# Patient Record
Sex: Female | Born: 1945
Health system: Southern US, Community
[De-identification: ages and names within clinical notes are randomized; demographics above are authoritative.]

## PROBLEM LIST (undated history)

## (undated) DIAGNOSIS — E7800 Pure hypercholesterolemia, unspecified: Secondary | ICD-10-CM

## (undated) DIAGNOSIS — F32A Depression, unspecified: Secondary | ICD-10-CM

## (undated) DIAGNOSIS — IMO0002 Reserved for concepts with insufficient information to code with codable children: Secondary | ICD-10-CM

## (undated) DIAGNOSIS — I10 Essential (primary) hypertension: Secondary | ICD-10-CM

## (undated) DIAGNOSIS — F329 Major depressive disorder, single episode, unspecified: Secondary | ICD-10-CM

## (undated) HISTORY — DX: Reserved for concepts with insufficient information to code with codable children: IMO0002

## (undated) HISTORY — PX: ELBOW ARTHROPLASTY: SHX928

---

## 2013-10-12 ENCOUNTER — Emergency Department (HOSPITAL_BASED_OUTPATIENT_CLINIC_OR_DEPARTMENT_OTHER)
Admission: EM | Admit: 2013-10-12 | Discharge: 2013-10-12 | Disposition: A | Discharge status: Home / Self Care | Payer: Medicare Other | Attending: Emergency Medicine | Admitting: Emergency Medicine

## 2013-10-12 ENCOUNTER — Encounter (HOSPITAL_BASED_OUTPATIENT_CLINIC_OR_DEPARTMENT_OTHER): Payer: Self-pay | Admitting: Emergency Medicine

## 2013-10-12 DIAGNOSIS — Z79899 Other long term (current) drug therapy: Secondary | ICD-10-CM | POA: Insufficient documentation

## 2013-10-12 DIAGNOSIS — G8918 Other acute postprocedural pain: Secondary | ICD-10-CM | POA: Insufficient documentation

## 2013-10-12 DIAGNOSIS — T85848A Pain due to other internal prosthetic devices, implants and grafts, initial encounter: Secondary | ICD-10-CM

## 2013-10-12 DIAGNOSIS — K089 Disorder of teeth and supporting structures, unspecified: Secondary | ICD-10-CM | POA: Insufficient documentation

## 2013-10-12 DIAGNOSIS — E7800 Pure hypercholesterolemia, unspecified: Secondary | ICD-10-CM | POA: Insufficient documentation

## 2013-10-12 DIAGNOSIS — K08109 Complete loss of teeth, unspecified cause, unspecified class: Secondary | ICD-10-CM | POA: Insufficient documentation

## 2013-10-12 DIAGNOSIS — I10 Essential (primary) hypertension: Secondary | ICD-10-CM | POA: Insufficient documentation

## 2013-10-12 HISTORY — DX: Essential (primary) hypertension: I10

## 2013-10-12 HISTORY — DX: Pure hypercholesterolemia, unspecified: E78.00

## 2013-10-12 MED ORDER — OXYCODONE-ACETAMINOPHEN 5-325 MG PO TABS: 1.0000 | ORAL_TABLET | ORAL | Status: DC | PRN

## 2013-10-12 MED ORDER — OXYCODONE-ACETAMINOPHEN 5-325 MG PO TABS
2.0000 | ORAL_TABLET | Freq: Once | ORAL | Status: AC
  Administered 2013-10-12: 2 via ORAL
  Filled 2013-10-12: qty 2

## 2013-10-12 NOTE — ED Notes (Addendum)
Patient reports that she has ongoing right upper gum pain since temporary bridge was placed 6 weeks ago. Has followed up with her dentist, oral surgeon, endodontist and all have told her that it is not related to her bridge. Dentist has recommended neurologist now and has appt for may 16th but patient doesn't want to wait that long. Taking vicodin with minimal relief.  No swelling or redness noted to gums, reports that the skin above her top lip is also very tender. Oral surgeon suggests that original dentist needs to remove temporary and place permanent bridge but dentist refusing.

## 2013-10-12 NOTE — Discharge Instructions (Signed)
Percocet as needed for pain.  Recommend that you follow up with your dentist regarding removal of the dental prosthesis.

## 2013-10-12 NOTE — ED Provider Notes (Signed)
CSN: [id number]     Arrival date & time 10/12/13  0093 History   First MD Initiated Contact with Patient 10/12/13 1049     Chief Complaint  Patient presents with  . Dental Problem      HPI  Presents with tooth and gum pain. She states that she had an injury to her T. several years ago. Her right teeth have been extracted. Ultimately her left incisor had to be extracted. She has a dental implant, temporary bridge is placed 6 weeks ago. She's had unrelenting pain in the area since that time. She has since been back to her dentist. Husband states "we've seen 5 dentist". Also see oral surgery and an endodontist. She states her regular dentist "refuses" to take the temporary bridge out. She states that he is suggested she see a neurologist because "it is neurological".  Past Medical History  Diagnosis Date  . Hypertension   . High cholesterol    History reviewed. No pertinent past surgical history. No family history on file. History  Substance Use Topics  . Smoking status: Never Smoker   . Smokeless tobacco: Not on file  . Alcohol Use: Not on file   OB History   Grav Para Term Preterm Abortions TAB SAB Ect Mult Living                 Review of Systems  HENT: Positive for dental problem.        Has pain just below her left naris. No rash or vesicles. No pain with chewing. Is not temperature-sensitive. No vesicles to the face. No vision changes. No jaw or neck pain      Allergies  Review of patient's allergies indicates no known allergies.  Home Medications   Prior to Admission medications   Medication Sig Start Date End Date Taking? Authorizing Provider  carvedilol (COREG) 6.25 MG tablet Take 6.25 mg by mouth 2 (two) times daily with a meal.   Yes Historical Provider, MD  simvastatin (ZOCOR) 40 MG tablet Take 40 mg by mouth daily.   Yes Historical Provider, MD  triamterene-hydrochlorothiazide (DYAZIDE) 37.5-25 MG per capsule Take 1 capsule by mouth daily.   Yes Historical  Provider, MD  zolpidem (AMBIEN) 5 MG tablet Take 5 mg by mouth at bedtime as needed for sleep.   Yes Historical Provider, MD  oxyCODONE-acetaminophen (PERCOCET/ROXICET) 5-325 MG per tablet Take 1-2 tablets by mouth every 4 (four) hours as needed. 10/12/13   Tanna Furry, MD   BP 142/70  Pulse 78  Temp(Src) 97.9 F (36.6 C) (Oral)  Resp 14  Ht 5\' 3"  (1.6 m)  Wt 124 lb (56.246 kg)  BMI 21.97 kg/m2  SpO2 99% Physical Exam  HENT:  Head:    Mouth/Throat:      ED Course  Procedures (including critical care time) Labs Review Labs Reviewed - No data to display  Imaging Review No results found.   EKG Interpretation None      MDM   Final diagnoses:  Dental implant pain    Patient has some obvious discomfort. Clinically no sign of tissue reaction erythema blistering gingival swelling pallor or buccal swelling. No external facial swelling. No symptoms of that suggest an alternate or neurological diagnoses such as trigeminal neuralgia. Nontender to palpate along the sinuses along the course of the infraorbital nerve. No asymmetry to suggest zoster. Her symptoms started when the implant was placed in a persisted. I recommended she see her dentist regarding extraction.    Elta Guadeloupe  Jeneen Rinks, MD 10/12/13 [phone]

## 2013-10-13 ENCOUNTER — Encounter: Payer: Self-pay | Admitting: Neurology

## 2013-10-14 ENCOUNTER — Encounter: Payer: Self-pay | Admitting: Neurology

## 2013-10-14 ENCOUNTER — Ambulatory Visit (INDEPENDENT_AMBULATORY_CARE_PROVIDER_SITE_OTHER): Payer: Medicare Other | Admitting: Neurology

## 2013-10-14 VITALS — BP 145/84 | HR 70 | Ht 63.0 in | Wt 125.0 lb

## 2013-10-14 DIAGNOSIS — IMO0002 Reserved for concepts with insufficient information to code with codable children: Secondary | ICD-10-CM

## 2013-10-14 DIAGNOSIS — M792 Neuralgia and neuritis, unspecified: Secondary | ICD-10-CM | POA: Insufficient documentation

## 2013-10-14 HISTORY — DX: Reserved for concepts with insufficient information to code with codable children: IMO0002

## 2013-10-14 MED ORDER — PREGABALIN 25 MG PO CAPS: ORAL_CAPSULE | ORAL | Status: DC

## 2013-10-14 NOTE — Patient Instructions (Signed)

## 2013-10-14 NOTE — Progress Notes (Signed)
Reason for visit: Neuralgia pain  Rebekah Mason is a 68 y.o. female  History of present illness:  Rebekah Mason is a 68 year old right-handed white female with a history of onset of a burning dysesthesia affecting the gum area just behind the left incisor, also affecting the skin just under the left naris. The patient indicates that the discomfort began immediately after a dental procedure 6 weeks ago. The patient had a tooth pulled at that point. The burning dysesthesias have been present since that procedure, slightly worsening over time. She denies any pain with chewing, talking, or swallowing. The patient indicates that the dysesthesias do not cross midline. She has no numbness of the face or inside the mouth. The patient has not noted any other sensory alteration on the body or face. She denies any weakness of the extremities, balance problems, or problems controlling the bowels or the bladder with exception that she does have some diarrhea at times. She indicates that there are no other headache problems, or neck problems. The patient is sent to this office for further evaluation.   Past Medical History  Diagnosis Date  . Hypertension   . High cholesterol   . Neuralgia, neuritis, and radiculitis, unspecified 10/14/2013    Past Surgical History  Procedure Laterality Date  . Elbow arthroplasty      2007    Family History  Problem Relation Age of Onset  . Heart attack Mother   . Hypertension Mother   . Aneurysm Father   . Hypertension Father     Social history:  reports that she has quit smoking. She has never used smokeless tobacco. She reports that she drinks about 8.4 ounces of alcohol per week. She reports that she does not use illicit drugs.  Medications:  Current Outpatient Prescriptions on File Prior to Visit  Medication Sig Dispense Refill  . carvedilol (COREG) 6.25 MG tablet Take 6.25 mg by mouth 2 (two) times daily with a meal.      . hydrOXYzine (ATARAX/VISTARIL)  50 MG tablet Take 50 mg by mouth daily.      Marland Kitchen oxyCODONE-acetaminophen (PERCOCET/ROXICET) 5-325 MG per tablet Take 1-2 tablets by mouth every 4 (four) hours as needed.  20 tablet  0  . simvastatin (ZOCOR) 40 MG tablet Take 40 mg by mouth daily.      Marland Kitchen triamterene-hydrochlorothiazide (DYAZIDE) 37.5-25 MG per capsule Take 1 capsule by mouth daily.      Marland Kitchen zolpidem (AMBIEN) 5 MG tablet Take 5 mg by mouth at bedtime as needed for sleep.       No current facility-administered medications on file prior to visit.     No Known Allergies  ROS:  Out of a complete 14 system review of symptoms, the patient complains only of the following symptoms, and all other reviewed systems are negative.  Weight loss Ringing in the ears Increased thirst Difficulty swallowing Anxiety, change in appetite Insomnia  Blood pressure 145/84, pulse 70, height 5\' 3"  (1.6 m), weight 125 lb (56.7 kg).  Physical Exam  General: The patient is alert and cooperative at the time of the examination.  Eyes: Pupils are equal, round, and reactive to light. Discs are flat bilaterally.  Neck: The neck is supple, no carotid bruits are noted.  Respiratory: The respiratory examination is clear.  Cardiovascular: The cardiovascular examination reveals a regular rate and rhythm, no obvious murmurs or rubs are noted.  Skin: Extremities are without significant edema.  Neurologic Exam  Mental status: The patient is  alert and oriented x 3 at the time of the examination. The patient has apparent normal recent and remote memory, with an apparently normal attention span and concentration ability.  Cranial nerves: Facial symmetry is present. There is good sensation of the face to pinprick and soft touch bilaterally. The strength of the facial muscles and the muscles to head turning and shoulder shrug are normal bilaterally. Speech is well enunciated, no aphasia or dysarthria is noted. Extraocular movements are full. Visual fields are  full. The tongue is midline, and the patient has symmetric elevation of the soft palate. No obvious hearing deficits are noted.  Motor: The motor testing reveals 5 over 5 strength of all 4 extremities. Good symmetric motor tone is noted throughout.  Sensory: Sensory testing is intact to pinprick, soft touch, vibration sensation, and position sense on all 4 extremities. No evidence of extinction is noted.  Coordination: Cerebellar testing reveals good finger-nose-finger and heel-to-shin bilaterally.  Gait and station: Gait is normal. Tandem gait is normal. Romberg is negative. No drift is seen.  Reflexes: Deep tendon reflexes are symmetric and normal bilaterally. Toes are downgoing bilaterally.   Assessment/Plan:  1. 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 The patient is on oxycodone for pain relief with some benefit. I will give a trial on Lyrica, gradually building up on the dose. If 50 mg twice daily is not effective, she will contact our office and we will continue to increase the dose. The patient will followup in 3 or 4 months. Hopefully over time, the neuralgia pain will resolve.  Jill Alexanders MD 10/14/2013 6:31 PM  [facility] [address] [place], [place] [address]  Phone [phone] Fax [phone]

## 2013-10-15 ENCOUNTER — Telehealth: Payer: Self-pay | Admitting: *Deleted

## 2013-10-15 NOTE — Telephone Encounter (Signed)
I have contacted insurance asking that they grant an exception and cover this drug.  They have forwarded the clinical info to medical review board for determination.  They will notify both [place] and the patient of the outcome.  I spoke with the patient, she is aware.

## 2013-10-15 NOTE — Telephone Encounter (Signed)
Patient calling about rx for pregabalin (LYRICA) 25 MG capsule hadn't been received @ [facility] [address].  Please call pt.  Thanks

## 2013-10-15 NOTE — Telephone Encounter (Signed)
Pt calling about Rx. Please advise

## 2013-10-15 NOTE — Telephone Encounter (Signed)
This Rx was faxed to the pharmacy yesterday, we have confirmation they received it.  I called the pharmacy.  Spoke with Jenny Reichmann.  He said they did get the Rx, but do not have the drug in stock.  They have ordered it, and will let the patient know when it arrives.  I called the patient back.  She is aware.

## 2013-10-16 ENCOUNTER — Telehealth: Payer: Self-pay

## 2013-10-16 NOTE — Telephone Encounter (Signed)
[employer] sent [place] a letter saying they have approved our request for coverage on Lyrica effective until 10/29/13 noting for titration purposes only. After that they require the dose be increased to a higher strength where patient is taking no more than 3 caps total daily.  Ref # [id number]

## 2013-10-17 DIAGNOSIS — IMO0002 Reserved for concepts with insufficient information to code with codable children: Secondary | ICD-10-CM

## 2013-10-17 HISTORY — DX: Reserved for concepts with insufficient information to code with codable children: IMO0002

## 2013-10-27 ENCOUNTER — Telehealth: Payer: Self-pay | Admitting: *Deleted

## 2013-10-27 NOTE — Telephone Encounter (Signed)
Patient calling and stated noticed hands are swollen.  Wanted to know if this was a side effect from pregabalin (LYRICA) 25 MG capsule.  Please call and advise.  Thanks

## 2013-10-28 NOTE — Telephone Encounter (Signed)
I called the patient. The lyrica is making her swell. She is on a diuretic, I have indicated that she needs to be careful about salt intake. The Lyrica could make her hands well, usually affects the feet and ankles.

## 2013-11-14 ENCOUNTER — Telehealth: Payer: Self-pay | Admitting: Neurology

## 2013-11-14 NOTE — Telephone Encounter (Signed)
Patient states she went to pick up her Rx for Lyrica at [facility] in [facility] and they told her it had to be pre authorized--pharmacy states paperwork was faxed to us--please advise patient--patient has only 2 days left of medication--thank you.

## 2013-11-14 NOTE — Telephone Encounter (Signed)
The pharmacy faxed [place] after hours yesterday, so we did not get the request until this morning.  I have contacted the ins and asked that they grant an exception.  I spoke with the patient, she is aware.  I spoke with the pharmacy as well.

## 2013-12-17 ENCOUNTER — Other Ambulatory Visit: Payer: Self-pay

## 2013-12-17 MED ORDER — PREGABALIN 50 MG PO CAPS: 50.0000 mg | ORAL_CAPSULE | Freq: Two times a day (BID) | ORAL | Status: DC

## 2013-12-17 NOTE — Telephone Encounter (Signed)
Ins will not pay for 25mg  two twice daily, but will pay for 50mg  one twice daily.  The total dose remains the same.

## 2013-12-17 NOTE — Telephone Encounter (Signed)
Rx signed and faxed.

## 2014-01-07 ENCOUNTER — Telehealth: Payer: Self-pay | Admitting: Neurology

## 2014-01-07 NOTE — Telephone Encounter (Signed)
Patient states she will run out of Lyrica before her scheduled 01/20/14 visit, requesting refill.

## 2014-01-07 NOTE — Telephone Encounter (Signed)
Refills were sent to the pharmacy earlier this month.  I called the pharmacy.  They said the patient does have refills on file and they will fill the Rx today.  I called the patient back.  She is aware.

## 2014-01-13 ENCOUNTER — Ambulatory Visit: Payer: Medicare Other | Admitting: Adult Health

## 2014-01-20 ENCOUNTER — Encounter: Payer: Self-pay | Admitting: Adult Health

## 2014-01-20 ENCOUNTER — Ambulatory Visit (INDEPENDENT_AMBULATORY_CARE_PROVIDER_SITE_OTHER): Payer: Medicare Other | Admitting: Adult Health

## 2014-01-20 VITALS — BP 150/95 | HR 83 | Ht 62.5 in | Wt 135.0 lb

## 2014-01-20 DIAGNOSIS — IMO0002 Reserved for concepts with insufficient information to code with codable children: Secondary | ICD-10-CM

## 2014-01-20 MED ORDER — PREGABALIN 50 MG PO CAPS: 50.0000 mg | ORAL_CAPSULE | Freq: Three times a day (TID) | ORAL | Status: DC

## 2014-01-20 NOTE — Patient Instructions (Signed)

## 2014-01-20 NOTE — Progress Notes (Signed)
PATIENT: Rebekah Mason DOB: 27-Jun-1945  REASON FOR VISIT: follow up HISTORY FROM: patient  HISTORY OF PRESENT ILLNESS: Rebekah Mason is a 68 year old female with a history of neuropathic pain affecting the gum just behind the left incisor. The pain occurred right after a dental procedure. She is currently taking lyrica and is tolerating it well. She reports that it has been beneficial for her pain. She states she has good days and bad days. Today she states that the discomfort is worse whereas yesterday her pain was under control. Patient reports that the discomfort is most notable in the afternoons. She does report some swelling on the Lyrica, mainly in the hands. She also reports tinnitus that was present before the dental procedure leading to this neuropathic discomfort. She states that it is usually has a buzzing or ringing quality. She states that it comes and goes. She does feel that it has improved over time. Patient returns today for follow-up.   REVIEW OF SYSTEMS: Full 14 system review of systems performed and notable only for:  Constitutional: N/A  Eyes: N/A Ear/Nose/Throat: Ringing in the ears Skin: N/A  Cardiovascular: N/A  Respiratory: N/A  Gastrointestinal: N/A  Genitourinary: N/A Hematology/Lymphatic: N/A  Endocrine: Excessive thirst Musculoskeletal: Joint swelling Allergy/Immunology: N/A  Neurological: N/A Psychiatric: Depression, nervous, anxious  Sleep: Insomnia  ALLERGIES: No Known Allergies  HOME MEDICATIONS: Outpatient Prescriptions Prior to Visit  Medication Sig Dispense Refill  . carvedilol (COREG) 6.25 MG tablet Take 6.25 mg by mouth 2 (two) times daily with a meal.      . chlorhexidine (PERIDEX) 0.12 % solution Use as directed 0.12 mLs in the mouth or throat daily.      . hydrOXYzine (ATARAX/VISTARIL) 50 MG tablet Take 50 mg by mouth daily.      . pregabalin (LYRICA) 50 MG capsule Take 1 capsule (50 mg total) by mouth 2 (two) times daily.  60 capsule  3    . triamterene-hydrochlorothiazide (DYAZIDE) 37.5-25 MG per capsule Take 1 capsule by mouth daily.      Marland Kitchen zolpidem (AMBIEN) 5 MG tablet Take 5 mg by mouth at bedtime as needed for sleep.      Marland Kitchen HYDROcodone-acetaminophen (NORCO/VICODIN) 5-325 MG per tablet Take 1 tablet by mouth 2 (two) times daily.      Marland Kitchen oxyCODONE-acetaminophen (PERCOCET/ROXICET) 5-325 MG per tablet Take 1-2 tablets by mouth every 4 (four) hours as needed.  20 tablet  0  . simvastatin (ZOCOR) 40 MG tablet Take 40 mg by mouth daily.       No facility-administered medications prior to visit.    PAST MEDICAL HISTORY: Past Medical History  Diagnosis Date  . Hypertension   . High cholesterol   . Neuralgia, neuritis, and radiculitis, unspecified 10/14/2013  . Ulcer 10/2013    PAST SURGICAL HISTORY: Past Surgical History  Procedure Laterality Date  . Elbow arthroplasty      2007    FAMILY HISTORY: Family History  Problem Relation Age of Onset  . Heart attack Mother   . Hypertension Mother   . Aneurysm Father   . Hypertension Father     SOCIAL HISTORY: History   Social History  . Marital Status: Married    Spouse Name: Herbie Baltimore    Number of Children: 3  . Years of Education: hs   Occupational History  . Retired    Social History Main Topics  . Smoking status: Former Research scientist (life sciences)  . Smokeless tobacco: Never Used  . Alcohol Use: 8.4  oz/week    14 Glasses of wine per week     Comment: daily  . Drug Use: No  . Sexual Activity: Not on file   Other Topics Concern  . Not on file   Social History Narrative   Patient is right handed ,resides in home with husband      PHYSICAL EXAM  Filed Vitals:   01/20/14 1325  BP: 150/95  Pulse: 83  Height: 5' 2.5" (1.588 m)  Weight: 135 lb (61.236 kg)   Body mass index is 24.28 kg/(m^2).  Generalized: Well developed, in no acute distress   Neck: Supple, no carotid bruits  Ears: Ear canal clear. Tympanic membrane intact, light reflex noted bilaterally.   Musculoskeletal: No deformity  Skin: No edema noted in hands or legs.   Neurological examination  Mentation: Alert oriented to time, place, history taking. Follows all commands speech and language fluent Cranial nerve II-XII: Extraocular movements were full, visual field were full on confrontational test.  Head turning and shoulder shrug  were normal and symmetric. Motor: The motor testing reveals 5 over 5 strength of all 4 extremities. Good symmetric motor tone is noted throughout.  Sensory: Sensory testing is intact to soft touch on all 4 extremities. No evidence of extinction is noted.  Coordination: Cerebellar testing reveals good finger-nose-finger and heel-to-shin bilaterally.  Gait and station: Gait is normal. Tandem gait is normal. Romberg is negative. No drift is seen.  Reflexes: Deep tendon reflexes are symmetric and normal bilaterally. Toes are downgoing bilaterally.   DIAGNOSTIC DATA (LABS, IMAGING, TESTING) - I reviewed patient records, labs, notes, testing and imaging myself where available.  ASSESSMENT AND PLAN 68 y.o. year old female  has a past medical history of Hypertension; High cholesterol; Neuralgia, neuritis, and radiculitis, unspecified (10/14/2013); and Ulcer (10/2013). here with:  1. Neuralgia/ neuritis  Patient's discomfort has been relieved by Lyrica. However she continues to have some days where the discomfort becomes most noticeable in the afternoons. I will increase the Lyrica to 50 mg 3 times a day. Patient also reports some tinnitus that has been present for some time. She does report that it has improved and it is not constant. If this continues to be an issue patient should make our office or her PCP aware. At that time a further workup may be warranted. Patient should follow up in 6 months or sooner if needed.  Ward Givens, MSN, NP-C 01/20/2014, 1:32 PM [facility] [address], [place], [place] [address] [phone]  Note:  This document was prepared with digital dictation and possible smart phrase technology. Any transcriptional errors that result from this process are unintentional.

## 2014-01-20 NOTE — Progress Notes (Signed)
I have read the note, and I agree with the clinical assessment and plan.  Kveon Casanas KEITH   

## 2014-05-06 ENCOUNTER — Encounter: Payer: Self-pay | Admitting: Neurology

## 2014-05-12 ENCOUNTER — Encounter: Payer: Self-pay | Admitting: Neurology

## 2014-05-13 ENCOUNTER — Other Ambulatory Visit: Payer: Self-pay | Admitting: Neurology

## 2014-05-18 ENCOUNTER — Other Ambulatory Visit: Payer: Self-pay

## 2014-05-18 ENCOUNTER — Telehealth: Payer: Self-pay | Admitting: Adult Health

## 2014-05-18 MED ORDER — PREGABALIN 50 MG PO CAPS: 50.0000 mg | ORAL_CAPSULE | Freq: Three times a day (TID) | ORAL | Status: DC

## 2014-05-18 NOTE — Telephone Encounter (Signed)
Patient requesting less expensive medication alternative for Rx pregabalin (LYRICA) 50 MG capsule.  Please call and advise.

## 2014-05-18 NOTE — Telephone Encounter (Signed)
I called the patient. I left a message on her cell phone. Her cell phone only rang one time then went straight to voicemail. Also tried to call her home phone but it appears this number is no longer working.

## 2014-05-19 MED ORDER — GABAPENTIN 100 MG PO CAPS: 100.0000 mg | ORAL_CAPSULE | Freq: Three times a day (TID) | ORAL | Status: DC

## 2014-05-19 NOTE — Telephone Encounter (Signed)
I called the patient. She states that she cannot afford the Lyrica. She has not tried gabapentin. I will start patient on a low dose she will begin taking gabapentin 100mg  3 times a day. In the future we may have to increase this dose. Patient verbalizes understanding.

## 2014-05-19 NOTE — Telephone Encounter (Signed)
Patient is returning your call. She can be reached on her cell# [phone] or home# [phone]. Thank you.

## 2014-05-19 NOTE — Addendum Note (Signed)
Addended by: Trudie Buckler on: 05/19/2014 10:00 AM   Modules accepted: Orders, Medications

## 2014-05-22 ENCOUNTER — Telehealth: Payer: Self-pay | Admitting: Adult Health

## 2014-05-22 NOTE — Telephone Encounter (Signed)
Pt is having some serious diarrhea that started when she started the gabapentin and wants to know what she can take.  The gabapentin (NEURONTIN) 100 MG capsule seems to be working.  Please call and advise.

## 2014-05-23 NOTE — Telephone Encounter (Signed)
I called back (was not able to access chart yesterday due to system outage).  Patient said she is doing better now.  She took Triangle yesterday and Immodium this morning.  Says she previously had issues with diarrhea, and this may just be a continuation of that.  I explained the importance of staying hydrated.  Patient verbalized understanding.  Says Gabapentin has worked very well, and she does not wish to change meds.  If she has any other questions, or if diarrhea begins again she will call us back.

## 2014-07-23 ENCOUNTER — Ambulatory Visit: Payer: Medicare Other | Admitting: Adult Health

## 2014-08-08 ENCOUNTER — Other Ambulatory Visit: Payer: Self-pay | Admitting: Adult Health

## 2014-09-03 ENCOUNTER — Ambulatory Visit (INDEPENDENT_AMBULATORY_CARE_PROVIDER_SITE_OTHER): Payer: Medicare Other | Admitting: Adult Health

## 2014-09-03 ENCOUNTER — Encounter: Payer: Self-pay | Admitting: Adult Health

## 2014-09-03 VITALS — BP 148/87 | HR 72 | Ht 62.0 in | Wt 132.0 lb

## 2014-09-03 DIAGNOSIS — M792 Neuralgia and neuritis, unspecified: Secondary | ICD-10-CM | POA: Diagnosis not present

## 2014-09-03 NOTE — Progress Notes (Signed)
PATIENT: Rebekah Mason DOB: 1945/07/14  REASON FOR VISIT: follow up- neuropathic pain HISTORY FROM: patient  HISTORY OF PRESENT ILLNESS: Rebekah Mason is a 69 year old female with a history of neuropathic pain affecting the area above her upper lip. She returns today for follow-up. She states that the gabapentin helped however in the last 2 weeks her pain has returned. She states that she had only been taking gabapentin as needed for pain. She states that it does cause her some drowsiness but overall she can tolerate it well. Patient states that she still has some tinnitus however that has improved. She denies any new neurological symptoms. Denies any new medical issues since last seen.  HISTORY 01/20/14: Rebekah Mason is a 69 year old female with a history of neuropathic pain affecting the gum just behind the left incisor. The pain occurred right after a dental procedure. She is currently taking lyrica and is tolerating it well. She reports that it has been beneficial for her pain. She states she has good days and bad days. Today she states that the discomfort is worse whereas yesterday her pain was under control. Patient reports that the discomfort is most notable in the afternoons. She does report some swelling on the Lyrica, mainly in the hands. She also reports tinnitus that was present before the dental procedure leading to this neuropathic discomfort. She states that it is usually has a buzzing or ringing quality. She states that it comes and goes. She does feel that it has improved over time. Patient returns today for follow-up.   REVIEW OF SYSTEMS: Out of a complete 14 system review of symptoms, the patient complains only of the following symptoms, and all other reviewed systems are negative.  Diarrhea, ringing in ears, excessive thirst  ALLERGIES: No Known Allergies  HOME MEDICATIONS: Outpatient Prescriptions Prior to Visit  Medication Sig Dispense Refill  . carvedilol (COREG) 6.25 MG  tablet Take 6.25 mg by mouth 2 (two) times daily with a meal.    . gabapentin (NEURONTIN) 100 MG capsule TAKE 1 CAPSULE (100 MG TOTAL) BY MOUTH 3 (THREE) TIMES DAILY. 90 capsule 3  . triamterene-hydrochlorothiazide (DYAZIDE) 37.5-25 MG per capsule Take 1 capsule by mouth daily.    Marland Kitchen zolpidem (AMBIEN) 5 MG tablet Take 5 mg by mouth at bedtime as needed for sleep.    . chlorhexidine (PERIDEX) 0.12 % solution Use as directed 0.12 mLs in the mouth or throat daily.    . hydrOXYzine (ATARAX/VISTARIL) 50 MG tablet Take 50 mg by mouth daily.    . Omeprazole (PRILOSEC PO) Take by mouth. Once daily     No facility-administered medications prior to visit.    PAST MEDICAL HISTORY: Past Medical History  Diagnosis Date  . Hypertension   . High cholesterol   . Neuralgia, neuritis, and radiculitis, unspecified 10/14/2013  . Ulcer 10/2013    PAST SURGICAL HISTORY: Past Surgical History  Procedure Laterality Date  . Elbow arthroplasty      2007    FAMILY HISTORY: Family History  Problem Relation Age of Onset  . Heart attack Mother   . Hypertension Mother   . Aneurysm Father   . Hypertension Father     SOCIAL HISTORY: History   Social History  . Marital Status: Married    Spouse Name: Herbie Baltimore  . Number of Children: 3  . Years of Education: hs   Occupational History  . Retired    Social History Main Topics  . Smoking status: Former Research scientist (life sciences)  .  Smokeless tobacco: Never Used     Comment: Quit 25 years ago.  . Alcohol Use: 1.8 oz/week    3 Glasses of wine per week     Comment: daily with dinner  . Drug Use: No  . Sexual Activity: Not on file   Other Topics Concern  . Not on file   Social History Narrative   Patient lives at home with her husband Herbie Baltimore).   Retired.   Education high school.   Right handed.   Caffeine none.      PHYSICAL EXAM  Filed Vitals:   09/03/14 1415  BP: 148/87  Pulse: 72  Height: 5\' 2"  (1.575 m)  Weight: 132 lb (59.875 kg)   Body mass index  is 24.14 kg/(m^2).  Generalized: Well developed, in no acute distress   Neurological examination  Mentation: Alert oriented to time, place, history taking. Follows all commands speech and language fluent Cranial nerve II-XII: Pupils were equal round reactive to light. Extraocular movements were full, visual field were full on confrontational test. Facial sensation and strength were normal. Uvula tongue midline. Head turning and shoulder shrug  were normal and symmetric. Motor: The motor testing reveals 5 over 5 strength of all 4 extremities. Good symmetric motor tone is noted throughout.  Sensory: Sensory testing is intact to soft touch on all 4 extremities. No evidence of extinction is noted.  Coordination: Cerebellar testing reveals good finger-nose-finger and heel-to-shin bilaterally.  Gait and station: Gait is normal. Tandem gait is normal. Romberg is negative. No drift is seen.  Reflexes: Deep tendon reflexes are symmetric and normal bilaterally.  Marland Kitchen   DIAGNOSTIC DATA (LABS, IMAGING, TESTING) - I reviewed patient records, labs, notes, testing and imaging myself where available.     ASSESSMENT AND PLAN 69 y.o. year old female  has a past medical history of Hypertension; High cholesterol; Neuralgia, neuritis, and radiculitis, unspecified (10/14/2013); and Ulcer (10/2013). here with:  1. Neuropathic pain  The patient did have good results with gabapentin however in the last 2 weeks her pain has increased. I have encouraged the patient to use gabapentin consistently 100 mg 3 times a day. If she is unable to tolerate the drowsiness she can decrease to twice a day. The patient verbalized understanding. If her symptoms worsen or fail to improve with gabapentin she will let us know. Otherwise she will follow-up in 6 months or sooner if needed.  Ward Givens, MSN, NP-C 09/03/2014, 2:29 PM [facility] [address], [place], [place] [address] [phone]  Note:  This document was prepared with digital dictation and possible smart phrase technology. Any transcriptional errors that result from this process are unintentional.

## 2014-09-03 NOTE — Progress Notes (Signed)
I have read the note, and I agree with the clinical assessment and plan.  Loriel Diehl KEITH   

## 2014-09-03 NOTE — Patient Instructions (Signed)
Try taking gabapentin consistently. 100 mg three times a day. If drowsiness occurs you may decrease to twice a day.  If your pain worsens or fails to improve with gabapentin please let us know.

## 2014-12-06 ENCOUNTER — Other Ambulatory Visit: Payer: Self-pay | Admitting: Adult Health

## 2015-03-09 ENCOUNTER — Ambulatory Visit: Payer: Medicare Other | Admitting: Adult Health

## 2015-05-14 ENCOUNTER — Emergency Department (HOSPITAL_BASED_OUTPATIENT_CLINIC_OR_DEPARTMENT_OTHER)
Admission: EM | Admit: 2015-05-14 | Discharge: 2015-05-14 | Disposition: A | Discharge status: Home / Self Care | Payer: Medicare Other | Attending: Emergency Medicine | Admitting: Emergency Medicine

## 2015-05-14 ENCOUNTER — Emergency Department (HOSPITAL_BASED_OUTPATIENT_CLINIC_OR_DEPARTMENT_OTHER): Payer: Medicare Other

## 2015-05-14 ENCOUNTER — Encounter (HOSPITAL_BASED_OUTPATIENT_CLINIC_OR_DEPARTMENT_OTHER): Payer: Self-pay

## 2015-05-14 DIAGNOSIS — Y9389 Activity, other specified: Secondary | ICD-10-CM | POA: Diagnosis not present

## 2015-05-14 DIAGNOSIS — F329 Major depressive disorder, single episode, unspecified: Secondary | ICD-10-CM | POA: Diagnosis not present

## 2015-05-14 DIAGNOSIS — S0990XA Unspecified injury of head, initial encounter: Secondary | ICD-10-CM | POA: Diagnosis present

## 2015-05-14 DIAGNOSIS — E7800 Pure hypercholesterolemia, unspecified: Secondary | ICD-10-CM | POA: Diagnosis not present

## 2015-05-14 DIAGNOSIS — S0101XA Laceration without foreign body of scalp, initial encounter: Secondary | ICD-10-CM | POA: Diagnosis not present

## 2015-05-14 DIAGNOSIS — S060X0A Concussion without loss of consciousness, initial encounter: Secondary | ICD-10-CM | POA: Diagnosis not present

## 2015-05-14 DIAGNOSIS — W01198A Fall on same level from slipping, tripping and stumbling with subsequent striking against other object, initial encounter: Secondary | ICD-10-CM | POA: Insufficient documentation

## 2015-05-14 DIAGNOSIS — Z79899 Other long term (current) drug therapy: Secondary | ICD-10-CM | POA: Diagnosis not present

## 2015-05-14 DIAGNOSIS — Y998 Other external cause status: Secondary | ICD-10-CM | POA: Diagnosis not present

## 2015-05-14 DIAGNOSIS — I10 Essential (primary) hypertension: Secondary | ICD-10-CM | POA: Diagnosis not present

## 2015-05-14 DIAGNOSIS — Z87891 Personal history of nicotine dependence: Secondary | ICD-10-CM | POA: Diagnosis not present

## 2015-05-14 DIAGNOSIS — Y92000 Kitchen of unspecified non-institutional (private) residence as  the place of occurrence of the external cause: Secondary | ICD-10-CM | POA: Diagnosis not present

## 2015-05-14 HISTORY — DX: Depression, unspecified: F32.A

## 2015-05-14 HISTORY — DX: Major depressive disorder, single episode, unspecified: F32.9

## 2015-05-14 NOTE — ED Provider Notes (Signed)
CSN: [id number]     Arrival date & time 05/14/15  [id number] History  By signing my name below, I, Helane Gunther, attest that this documentation has been prepared under the direction and in the presence of Debby Freiberg, MD. Electronically Signed: Helane Gunther, ED Scribe. 05/14/2015. 8:32 PM.     Chief Complaint  Patient presents with  . Head Injury   The history is provided by the patient. No language interpreter was used.   HPI Comments: Rebekah Mason is a 69 y.o. female who presents to the Emergency Department complaining of a head injury sustained 30 minutes ago. Pt states she was in the kitchen when she slipped in some water on the floor and struck her head on the kitchen cabinets, sustaining a laceration to the left side of her skull. She reports associated HA and nausea. She reports a PMHx of HTN and high cholesterol. She states she is UTD on her tetanus. Pt denies LOC, visual disturbances, and vomiting.   Past Medical History  Diagnosis Date  . Hypertension   . High cholesterol   . Neuralgia, neuritis, and radiculitis, unspecified 10/14/2013  . Ulcer 10/2013  . Depression    Past Surgical History  Procedure Laterality Date  . Elbow arthroplasty      2007   Family History  Problem Relation Age of Onset  . Heart attack Mother   . Hypertension Mother   . Aneurysm Father   . Hypertension Father    Social History  Substance Use Topics  . Smoking status: Former Research scientist (life sciences)  . Smokeless tobacco: Never Used     Comment: Quit 25 years ago.  . Alcohol Use: 1.8 oz/week    3 Glasses of wine per week     Comment: daily with dinner   OB History    No data available     Review of Systems  Eyes: Negative for visual disturbance.  Gastrointestinal: Positive for nausea. Negative for vomiting.  Skin: Positive for wound.  Neurological: Positive for headaches. Negative for syncope.  All other systems reviewed and are negative.   Allergies  Review of patient's allergies indicates no  known allergies.  Home Medications   Prior to Admission medications   Medication Sig Start Date End Date Taking? Authorizing Provider  Sertraline HCl (ZOLOFT PO) Take by mouth.   Yes Historical Provider, MD  atorvastatin (LIPITOR) 20 MG tablet 20 mg daily. 08/04/14   Historical Provider, MD  carvedilol (COREG) 6.25 MG tablet Take 6.25 mg by mouth 2 (two) times daily with a meal.    Historical Provider, MD  gabapentin (NEURONTIN) 100 MG capsule TAKE 1 CAPSULE (100 MG TOTAL) BY MOUTH 3 (THREE) TIMES DAILY. 12/06/14   Ward Givens, NP  omeprazole (PRILOSEC) 40 MG capsule 40 mg daily. 08/08/14   Historical Provider, MD  traZODone (DESYREL) 50 MG tablet 50 mg as needed. 08/30/14   Historical Provider, MD  triamterene-hydrochlorothiazide (DYAZIDE) 37.5-25 MG per capsule Take 1 capsule by mouth daily.    Historical Provider, MD  zolpidem (AMBIEN) 5 MG tablet Take 5 mg by mouth at bedtime as needed for sleep.    Historical Provider, MD   BP 136/80 mmHg  Pulse 73  Temp(Src) 98.6 F (37 C) (Oral)  Resp 18  Ht 5\' 3"  (1.6 m)  Wt 130 lb (58.968 kg)  BMI 23.03 kg/m2  SpO2 97% Physical Exam  Constitutional: She is oriented to person, place, and time. She appears well-developed and well-nourished.  HENT:  Head: Normocephalic. Head is with  laceration (4 cm to L parietum).  Right Ear: External ear normal.  Left Ear: External ear normal.  Eyes: Conjunctivae and EOM are normal. Pupils are equal, round, and reactive to light.  Neck: Normal range of motion. Neck supple.  Cardiovascular: Normal rate, regular rhythm, normal heart sounds and intact distal pulses.   Pulmonary/Chest: Effort normal and breath sounds normal.  Abdominal: Soft. Bowel sounds are normal. There is no tenderness.  Musculoskeletal: Normal range of motion.  Neurological: She is alert and oriented to person, place, and time. She has normal strength and normal reflexes. No cranial nerve deficit or sensory deficit. Coordination normal.  GCS eye subscore is 4. GCS verbal subscore is 5. GCS motor subscore is 6.  Skin: Skin is warm and dry.  Vitals reviewed.   ED Course  .Marland KitchenLaceration Repair Date/Time: 05/14/2015 11:40 PM Performed by: Debby Freiberg Authorized by: Debby Freiberg Consent: Verbal consent obtained. Risks and benefits: risks, benefits and alternatives were discussed Body area: head/neck Location details: scalp Laceration length: 4 cm Foreign bodies: no foreign bodies Tendon involvement: none Nerve involvement: none Anesthesia: see MAR for details Irrigation solution: saline Irrigation method: syringe Amount of cleaning: standard Debridement: none Degree of undermining: none Skin closure: staples Number of sutures: 4 Technique: simple Approximation: close    DIAGNOSTIC STUDIES: Oxygen Saturation is 97% on RA, adequate by my interpretation.    COORDINATION OF CARE: 7:34 PM - Discussed plans to cleanse and staple the wound. Will order a CT scan.  Pt advised of plan for treatment and pt agrees.  Labs Review Labs Reviewed - No data to display  Imaging Review Ct Head Wo Contrast  05/14/2015  CLINICAL DATA:  Fall in kitchen 1 hour ago striking left occipital region. EXAM: CT HEAD WITHOUT CONTRAST TECHNIQUE: Contiguous axial images were obtained from the base of the skull through the vertex without intravenous contrast. COMPARISON:  None. FINDINGS: Ventricles, cisterns and other CSF spaces are within normal. There is no mass, mass effect, shift of midline structures or acute hemorrhage. There is no evidence of acute infarction. Small left posterior parietal scalp contusion. No fracture. IMPRESSION: No acute intracranial findings. Small left posterior parietal scalp contusion.  No fracture. Electronically Signed   By: Marin Olp M.D.   On: 05/14/2015 20:26   I have personally reviewed and evaluated these images and lab results as part of my medical decision-making.   EKG Interpretation None       MDM   Final diagnoses:  Concussion, without loss of consciousness, initial encounter    69 y.o. female with pertinent PMH of HTN, HLD, depression presents with mechanical fall with hit to L head.  No LOC, pt has had mild ha and nausea, otherwise without symptoms (no vomiting, numbness, weakness).  On arrival vitals and physical exam as above.  Repaired laceration as above.    I have reviewed all laboratory and imaging studies if ordered as above  1. Concussion, without loss of consciousness, initial encounter           Debby Freiberg, MD 05/14/15 [id number]

## 2015-05-14 NOTE — ED Notes (Signed)
Slipped in water in home and hit head on cabinet-lac noted to back of head

## 2015-05-14 NOTE — Discharge Instructions (Signed)
Stitches, Staples, or Adhesive Wound Closure Health care providers use stitches (sutures), staples, and certain glue (skin adhesives) to hold skin together while it heals (wound closure). You may need this treatment after you have surgery or if you cut your skin accidentally. These methods help your skin to heal more quickly and make it less likely that you will have a scar. A wound may take several months to heal completely. The type of wound you have determines when your wound gets closed. In most cases, the wound is closed as soon as possible (primary skin closure). Sometimes, closure is delayed so the wound can be cleaned and allowed to heal naturally. This reduces the chance of infection. Delayed closure may be needed if your wound:  Is caused by a bite.  Happened more than 6 hours ago.  Involves loss of skin or the tissues under the skin.  Has dirt or debris in it that cannot be removed.  Is infected. WHAT ARE THE DIFFERENT KINDS OF WOUND CLOSURES? There are many options for wound closure. The one that your health care provider uses depends on how deep and how large your wound is. Adhesive Glue To use this type of glue to close a wound, your health care provider holds the edges of the wound together and paints the glue on the surface of your skin. You may need more than one layer of glue. Then the wound may be covered with a light bandage (dressing). This type of skin closure may be used for small wounds that are not deep (superficial). Using glue for wound closure is less painful than other methods. It does not require a medicine that numbs the area (local anesthetic). This method also leaves nothing to be removed. Adhesive glue is often used for children and on facial wounds. Adhesive glue cannot be used for wounds that are deep, uneven, or bleeding. It is not used inside of a wound.  Adhesive Strips These strips are made of sticky (adhesive), porous paper. They are applied across your  skin edges like a regular adhesive bandage. You leave them on until they fall off. Adhesive strips may be used to close very superficial wounds. They may also be used along with sutures to improve the closure of your skin edges.  Sutures Sutures are the oldest method of wound closure. Sutures can be made from natural substances, such as silk, or from synthetic materials, such as nylon and steel. They can be made from a material that your body can break down as your wound heals (absorbable), or they can be made from a material that needs to be removed from your skin (nonabsorbable). They come in many different strengths and sizes. Your health care provider attaches the sutures to a steel needle on one end. Sutures can be passed through your skin, or through the tissues beneath your skin. Then they are tied and cut. Your skin edges may be closed in one continuous stitch or in separate stitches. Sutures are strong and can be used for all kinds of wounds. Absorbable sutures may be used to close tissues under the skin. The disadvantage of sutures is that they may cause skin reactions that lead to infection. Nonabsorbable sutures need to be removed. Staples When surgical staples are used to close a wound, the edges of your skin on both sides of the wound are brought close together. A staple is placed across the wound, and an instrument secures the edges together. Staples are often used to close surgical cuts (  incisions). Staples are faster to use than sutures, and they cause less skin reaction. Staples need to be removed using a tool that bends the staples away from your skin. HOW DO I CARE FOR MY WOUND CLOSURE?  Take medicines only as directed by your health care provider.  If you were prescribed an antibiotic medicine for your wound, finish it all even if you start to feel better.  Use ointments or creams only as directed by your health care provider.  Wash your hands with soap and water before and  after touching your wound.  Do not soak your wound in water. Do not take baths, swim, or use a hot tub until your health care provider approves.  Ask your health care provider when you can start showering. Cover your wound if directed by your health care provider.  Do not take out your own sutures or staples.  Do not pick at your wound. Picking can cause an infection.  Keep all follow-up visits as directed by your health care provider. This is important. HOW LONG WILL I HAVE MY WOUND CLOSURE?  Leave adhesive glue on your skin until the glue peels away.  Leave adhesive strips on your skin until the strips fall off.  Absorbable sutures will dissolve within several days.  Nonabsorbable sutures and staples must be removed. The location of the wound will determine how long they stay in. This can range from several days to a couple of weeks. WHEN SHOULD I SEEK HELP FOR MY WOUND CLOSURE? Contact your health care provider if:  You have a fever.  You have chills.  You have drainage, redness, swelling, or pain at your wound.  There is a bad smell coming from your wound.  The skin edges of your wound start to separate after your sutures have been removed.  Your wound becomes thick, raised, and darker in color after your sutures come out (scarring).   This information is not intended to replace advice given to you by your health care provider. Make sure you discuss any questions you have with your health care provider.   Document Released: 02/28/2001 Document Revised: 06/26/2014 Document Reviewed: 11/12/2013 Elsevier Interactive Patient Education 2016 [place], [place] A concussion, or closed-head injury, is a brain injury caused by a direct blow to the head or by a quick and sudden movement (jolt) of the head or neck. Concussions are usually not life-threatening. Even so, the effects of a concussion can be serious. If you have had a concussion before, you are more likely  to experience concussion-like symptoms after a direct blow to the head.  CAUSES  Direct blow to the head, such as from running into another player during a soccer game, being hit in a fight, or hitting your head on a hard surface.  A jolt of the head or neck that causes the brain to move back and forth inside the skull, such as in a car crash. SIGNS AND SYMPTOMS The signs of a concussion can be hard to notice. Early on, they may be missed by you, family members, and health care providers. You may look fine but act or feel differently. Symptoms are usually temporary, but they may last for days, weeks, or even longer. Some symptoms may appear right away while others may not show up for hours or days. Every head injury is different. Symptoms include:  Mild to moderate headaches that will not go away.  A feeling of pressure inside your head.  Having more trouble  than usual:  Learning or remembering things you have heard.  Answering questions.  Paying attention or concentrating.  Organizing daily tasks.  Making decisions and solving problems.  Slowness in thinking, acting or reacting, speaking, or reading.  Getting lost or being easily confused.  Feeling tired all the time or lacking energy (fatigued).  Feeling drowsy.  Sleep disturbances.  Sleeping more than usual.  Sleeping less than usual.  Trouble falling asleep.  Trouble sleeping (insomnia).  Loss of balance or feeling lightheaded or dizzy.  Nausea or vomiting.  Numbness or tingling.  Increased sensitivity to:  Sounds.  Lights.  Distractions.  Vision problems or eyes that tire easily.  Diminished sense of taste or smell.  Ringing in the ears.  Mood changes such as feeling sad or anxious.  Becoming easily irritated or angry for little or no reason.  Lack of motivation.  Seeing or hearing things other people do not see or hear (hallucinations). DIAGNOSIS Your health care provider can usually  diagnose a concussion based on a description of your injury and symptoms. He or she will ask whether you passed out (lost consciousness) and whether you are having trouble remembering events that happened right before and during your injury. Your evaluation might include:  A brain scan to look for signs of injury to the brain. Even if the test shows no injury, you may still have a concussion.  Blood tests to be sure other problems are not present. TREATMENT  Concussions are usually treated in an emergency department, in urgent care, or at a clinic. You may need to stay in the hospital overnight for further treatment.  Tell your health care provider if you are taking any medicines, including prescription medicines, over-the-counter medicines, and natural remedies. Some medicines, such as blood thinners (anticoagulants) and aspirin, may increase the chance of complications. Also tell your health care provider whether you have had alcohol or are taking illegal drugs. This information may affect treatment.  Your health care provider will send you home with important instructions to follow.  How fast you will recover from a concussion depends on many factors. These factors include how severe your concussion is, what part of your brain was injured, your age, and how healthy you were before the concussion.  Most people with mild injuries recover fully. Recovery can take time. In general, recovery is slower in older persons. Also, persons who have had a concussion in the past or have other medical problems may find that it takes longer to recover from their current injury. HOME CARE INSTRUCTIONS General Instructions  Carefully follow the directions your health care provider gave you.  Only take over-the-counter or prescription medicines for pain, discomfort, or fever as directed by your health care provider.  Take only those medicines that your health care provider has approved.  Do not drink  alcohol until your health care provider says you are well enough to do so. Alcohol and certain other drugs may slow your recovery and can put you at risk of further injury.  If it is harder than usual to remember things, write them down.  If you are easily distracted, try to do one thing at a time. For example, do not try to watch TV while fixing dinner.  Talk with family members or close friends when making important decisions.  Keep all follow-up appointments. Repeated evaluation of your symptoms is recommended for your recovery.  Watch your symptoms and tell others to do the same. Complications sometimes occur after a  concussion. Older adults with a brain injury may have a higher risk of serious complications, such as a blood clot on the brain.  Tell your teachers, school nurse, school counselor, coach, athletic trainer, or work Freight forwarder about your injury, symptoms, and restrictions. Tell them about what you can or cannot do. They should watch for:  Increased problems with attention or concentration.  Increased difficulty remembering or learning new information.  Increased time needed to complete tasks or assignments.  Increased irritability or decreased ability to cope with stress.  Increased symptoms.  Rest. Rest helps the brain to heal. Make sure you:  Get plenty of sleep at night. Avoid staying up late at night.  Keep the same bedtime hours on weekends and weekdays.  Rest during the day. Take daytime naps or rest breaks when you feel tired.  Limit activities that require a lot of thought or concentration. These include:  Doing homework or job-related work.  Watching TV.  Working on the computer.  Avoid any situation where there is potential for another head injury (football, hockey, soccer, basketball, martial arts, downhill snow sports and horseback riding). Your condition will get worse every time you experience a concussion. You should avoid these activities until you  are evaluated by the appropriate follow-up health care providers. Returning To Your Regular Activities You will need to return to your normal activities slowly, not all at once. You must give your body and brain enough time for recovery.  Do not return to sports or other athletic activities until your health care provider tells you it is safe to do so.  Ask your health care provider when you can drive, ride a bicycle, or operate heavy machinery. Your ability to react may be slower after a brain injury. Never do these activities if you are dizzy.  Ask your health care provider about when you can return to work or school. Preventing Another Concussion It is very important to avoid another brain injury, especially before you have recovered. In rare cases, another injury can lead to permanent brain damage, brain swelling, or death. The risk of this is greatest during the first 7-10 days after a head injury. Avoid injuries by:  Wearing a seat belt when riding in a car.  Drinking alcohol only in moderation.  Wearing a helmet when biking, skiing, skateboarding, skating, or doing similar activities.  Avoiding activities that could lead to a second concussion, such as contact or recreational sports, until your health care provider says it is okay.  Taking safety measures in your home.  Remove clutter and tripping hazards from floors and stairways.  Use grab bars in bathrooms and handrails by stairs.  Place non-slip mats on floors and in bathtubs.  Improve lighting in dim areas. SEEK MEDICAL CARE IF:  You have increased problems paying attention or concentrating.  You have increased difficulty remembering or learning new information.  You need more time to complete tasks or assignments than before.  You have increased irritability or decreased ability to cope with stress.  You have more symptoms than before. Seek medical care if you have any of the following symptoms for more than 2  weeks after your injury:  Lasting (chronic) headaches.  Dizziness or balance problems.  Nausea.  Vision problems.  Increased sensitivity to noise or light.  Depression or mood swings.  Anxiety or irritability.  Memory problems.  Difficulty concentrating or paying attention.  Sleep problems.  Feeling tired all the time. SEEK IMMEDIATE MEDICAL CARE IF:  You  have severe or worsening headaches. These may be a sign of a blood clot in the brain.  You have weakness (even if only in one hand, leg, or part of the face).  You have numbness.  You have decreased coordination.  You vomit repeatedly.  You have increased sleepiness.  One pupil is larger than the other.  You have convulsions.  You have slurred speech.  You have increased confusion. This may be a sign of a blood clot in the brain.  You have increased restlessness, agitation, or irritability.  You are unable to recognize people or places.  You have neck pain.  It is difficult to wake you up.  You have unusual behavior changes.  You lose consciousness. MAKE SURE YOU:  Understand these instructions.  Will watch your condition.  Will get help right away if you are not doing well or get worse.   This information is not intended to replace advice given to you by your health care provider. Make sure you discuss any questions you have with your health care provider.   Document Released: 08/26/2003 Document Revised: 06/26/2014 Document Reviewed: 12/26/2012 Else[employer] Interactive Patient Education [employer].

## 2015-06-03 ENCOUNTER — Encounter (HOSPITAL_BASED_OUTPATIENT_CLINIC_OR_DEPARTMENT_OTHER): Payer: Self-pay | Admitting: *Deleted

## 2015-06-03 ENCOUNTER — Emergency Department (HOSPITAL_BASED_OUTPATIENT_CLINIC_OR_DEPARTMENT_OTHER)
Admission: EM | Admit: 2015-06-03 | Discharge: 2015-06-03 | Disposition: A | Discharge status: Home / Self Care | Payer: Medicare Other | Attending: Emergency Medicine | Admitting: Emergency Medicine

## 2015-06-03 ENCOUNTER — Emergency Department (HOSPITAL_BASED_OUTPATIENT_CLINIC_OR_DEPARTMENT_OTHER): Payer: Medicare Other

## 2015-06-03 DIAGNOSIS — Z87891 Personal history of nicotine dependence: Secondary | ICD-10-CM | POA: Insufficient documentation

## 2015-06-03 DIAGNOSIS — S0101XA Laceration without foreign body of scalp, initial encounter: Secondary | ICD-10-CM | POA: Diagnosis not present

## 2015-06-03 DIAGNOSIS — Y9389 Activity, other specified: Secondary | ICD-10-CM | POA: Diagnosis not present

## 2015-06-03 DIAGNOSIS — Z79899 Other long term (current) drug therapy: Secondary | ICD-10-CM | POA: Insufficient documentation

## 2015-06-03 DIAGNOSIS — Y998 Other external cause status: Secondary | ICD-10-CM | POA: Diagnosis not present

## 2015-06-03 DIAGNOSIS — I10 Essential (primary) hypertension: Secondary | ICD-10-CM | POA: Insufficient documentation

## 2015-06-03 DIAGNOSIS — F329 Major depressive disorder, single episode, unspecified: Secondary | ICD-10-CM | POA: Diagnosis not present

## 2015-06-03 DIAGNOSIS — W19XXXA Unspecified fall, initial encounter: Secondary | ICD-10-CM

## 2015-06-03 DIAGNOSIS — Y9289 Other specified places as the place of occurrence of the external cause: Secondary | ICD-10-CM | POA: Diagnosis not present

## 2015-06-03 DIAGNOSIS — W07XXXA Fall from chair, initial encounter: Secondary | ICD-10-CM | POA: Diagnosis not present

## 2015-06-03 DIAGNOSIS — E7800 Pure hypercholesterolemia, unspecified: Secondary | ICD-10-CM | POA: Diagnosis not present

## 2015-06-03 DIAGNOSIS — S0990XA Unspecified injury of head, initial encounter: Secondary | ICD-10-CM

## 2015-06-03 MED ORDER — LIDOCAINE-EPINEPHRINE (PF) 2 %-1:200000 IJ SOLN
10.0000 mL | Freq: Once | INTRAMUSCULAR | Status: AC
  Administered 2015-06-03: 10 mL
  Filled 2015-06-03: qty 10

## 2015-06-03 NOTE — Discharge Instructions (Signed)
Laceration Care, Adult Staple removal in 7-10 days. A laceration is a cut that goes through all of the layers of the skin and into the tissue that is right under the skin. Some lacerations heal on their own. Others need to be closed with stitches (sutures), staples, skin adhesive strips, or skin glue. Proper laceration care minimizes the risk of infection and helps the laceration to heal better. HOW TO CARE FOR YOUR LACERATION If sutures or staples were used:  Keep the wound clean and dry.  If you were given a bandage (dressing), you should change it at least one time per day or as told by your health care provider. You should also change it if it becomes wet or dirty.  Keep the wound completely dry for the first 24 hours or as told by your health care provider. After that time, you may shower or bathe. However, make sure that the wound is not soaked in water until after the sutures or staples have been removed.  Clean the wound one time each day or as told by your health care provider:  Wash the wound with soap and water.  Rinse the wound with water to remove all soap.  Pat the wound dry with a clean towel. Do not rub the wound.  After cleaning the wound, apply a thin layer of antibiotic ointmentas told by your health care provider. This will help to prevent infection and keep the dressing from sticking to the wound.  Have the sutures or staples removed as told by your health care provider. If skin adhesive strips were used:  Keep the wound clean and dry.  If you were given a bandage (dressing), you should change it at least one time per day or as told by your health care provider. You should also change it if it becomes dirty or wet.  Do not get the skin adhesive strips wet. You may shower or bathe, but be careful to keep the wound dry.  If the wound gets wet, pat it dry with a clean towel. Do not rub the wound.  Skin adhesive strips fall off on their own. You may trim the strips  as the wound heals. Do not remove skin adhesive strips that are still stuck to the wound. They will fall off in time. If skin glue was used:  Try to keep the wound dry, but you may briefly wet it in the shower or bath. Do not soak the wound in water, such as by swimming.  After you have showered or bathed, gently pat the wound dry with a clean towel. Do not rub the wound.  Do not do any activities that will make you sweat heavily until the skin glue has fallen off on its own.  Do not apply liquid, cream, or ointment medicine to the wound while the skin glue is in place. Using those may loosen the film before the wound has healed.  If you were given a bandage (dressing), you should change it at least one time per day or as told by your health care provider. You should also change it if it becomes dirty or wet.  If a dressing is placed over the wound, be careful not to apply tape directly over the skin glue. Doing that may cause the glue to be pulled off before the wound has healed.  Do not pick at the glue. The skin glue usually remains in place for 5-10 days, then it falls off of the skin. General Instructions  Take over-the-counter and prescription medicines only as told by your health care provider.  If you were prescribed an antibiotic medicine or ointment, take or apply it as told by your doctor. Do not stop using it even if your condition improves.  To help prevent scarring, make sure to cover your wound with sunscreen whenever you are outside after stitches are removed, after adhesive strips are removed, or when glue remains in place and the wound is healed. Make sure to wear a sunscreen of at least 30 SPF.  Do not scratch or pick at the wound.  Keep all follow-up visits as told by your health care provider. This is important.  Check your wound every day for signs of infection. Watch for:  Redness, swelling, or pain.  Fluid, blood, or pus.  Raise (elevate) the injured area  above the level of your heart while you are sitting or lying down, if possible. SEEK MEDICAL CARE IF:  You received a tetanus shot and you have swelling, severe pain, redness, or bleeding at the injection site.  You have a fever.  A wound that was closed breaks open.  You notice a bad smell coming from your wound or your dressing.  You notice something coming out of the wound, such as wood or glass.  Your pain is not controlled with medicine.  You have increased redness, swelling, or pain at the site of your wound.  You have fluid, blood, or pus coming from your wound.  You notice a change in the color of your skin near your wound.  You need to change the dressing frequently due to fluid, blood, or pus draining from the wound.  You develop a new rash.  You develop numbness around the wound. SEEK IMMEDIATE MEDICAL CARE IF:  You develop severe swelling around the wound.  Your pain suddenly increases and is severe.  You develop painful lumps near the wound or on skin that is anywhere on your body.  You have a red streak going away from your wound.  The wound is on your hand or foot and you cannot properly move a finger or toe.  The wound is on your hand or foot and you notice that your fingers or toes look pale or bluish.   This information is not intended to replace advice given to you by your health care provider. Make sure you discuss any questions you have with your health care provider.   Document Released: 06/05/2005 Document Revised: 10/20/2014 Document Reviewed: 06/01/2014 Elsevier Interactive Patient Education 2016 Elsevier Inc. Alcohol Use Disorder Alcohol use disorder is a mental disorder. It is not a one-time incident of heavy drinking. Alcohol use disorder is the excessive and uncontrollable use of alcohol over time that leads to problems with functioning in one or more areas of daily living. People with this disorder risk harming themselves and others when  they drink to excess. Alcohol use disorder also can cause other mental disorders, such as mood and anxiety disorders, and serious physical problems. People with alcohol use disorder often misuse other drugs.  Alcohol use disorder is common and widespread. Some people with this disorder drink alcohol to cope with or escape from negative life events. Others drink to relieve chronic pain or symptoms of mental illness. People with a family history of alcohol use disorder are at higher risk of losing control and using alcohol to excess.  Drinking too much alcohol can cause injury, accidents, and health problems. One drink can be too much when  you are:  Working.  Pregnant or breastfeeding.  Taking medicines. Ask your doctor.  Driving or planning to drive. SYMPTOMS  Signs and symptoms of alcohol use disorder may include the following:   Consumption ofalcohol inlarger amounts or over a longer period of time than intended.  Multiple unsuccessful attempts to cutdown or control alcohol use.   A great deal of time spent obtaining alcohol, using alcohol, or recovering from the effects of alcohol (hangover).  A strong desire or urge to use alcohol (cravings).   Continued use of alcohol despite problems at work, school, or home because of alcohol use.   Continued use of alcohol despite problems in relationships because of alcohol use.  Continued use of alcohol in situations when it is physically hazardous, such as driving a car.  Continued use of alcohol despite awareness of a physical or psychological problem that is likely related to alcohol use. Physical problems related to alcohol use can involve the brain, heart, liver, stomach, and intestines. Psychological problems related to alcohol use include intoxication, depression, anxiety, psychosis, delirium, and dementia.   The need for increased amounts of alcohol to achieve the same desired effect, or a decreased effect from the consumption of  the same amount of alcohol (tolerance).  Withdrawal symptoms upon reducing or stopping alcohol use, or alcohol use to reduce or avoid withdrawal symptoms. Withdrawal symptoms include:  Racing heart.  Hand tremor.  Difficulty sleeping.  Nausea.  Vomiting.  Hallucinations.  Restlessness.  Seizures. DIAGNOSIS Alcohol use disorder is diagnosed through an assessment by your health care provider. Your health care provider may start by asking three or four questions to screen for excessive or problematic alcohol use. To confirm a diagnosis of alcohol use disorder, at least two symptoms must be present within a 86-month period. The severity of alcohol use disorder depends on the number of symptoms:  Mild--two or three.  Moderate--four or five.  Severe--six or more. Your health care provider may perform a physical exam or use results from lab tests to see if you have physical problems resulting from alcohol use. Your health care provider may refer you to a mental health professional for evaluation. TREATMENT  Some people with alcohol use disorder are able to reduce their alcohol use to low-risk levels. Some people with alcohol use disorder need to quit drinking alcohol. When necessary, mental health professionals with specialized training in substance use treatment can help. Your health care provider can help you decide how severe your alcohol use disorder is and what type of treatment you need. The following forms of treatment are available:   Detoxification. Detoxification involves the use of prescription medicines to prevent alcohol withdrawal symptoms in the first week after quitting. This is important for people with a history of symptoms of withdrawal and for heavy drinkers who are likely to have withdrawal symptoms. Alcohol withdrawal can be dangerous and, in severe cases, cause death. Detoxification is usually provided in a hospital or in-patient substance use treatment  facility.  Counseling or talk therapy. Talk therapy is provided by substance use treatment counselors. It addresses the reasons people use alcohol and ways to keep them from drinking again. The goals of talk therapy are to help people with alcohol use disorder find healthy activities and ways to cope with life stress, to identify and avoid triggers for alcohol use, and to handle cravings, which can cause relapse.  Medicines.Different medicines can help treat alcohol use disorder through the following actions:  Decrease alcohol cravings.  Decrease  the positive reward response felt from alcohol use.  Produce an uncomfortable physical reaction when alcohol is used (aversion therapy).  Support groups. Support groups are run by people who have quit drinking. They provide emotional support, advice, and guidance. These forms of treatment are often combined. Some people with alcohol use disorder benefit from intensive combination treatment provided by specialized substance use treatment centers. Both inpatient and outpatient treatment programs are available.   This information is not intended to replace advice given to you by your health care provider. Make sure you discuss any questions you have with your health care provider.   Document Released: 07/13/2004 Document Revised: 06/26/2014 Document Reviewed: 09/12/2012 Elsevier Interactive Patient Education 2016 [employer].  Emergency Department Resource Guide 1) Find a Doctor and Pay Out of Pocket Although you won't have to find out who is covered by your insurance plan, it is a good idea to ask around and get recommendations. You will then need to call the office and see if the doctor you have chosen will accept you as a new patient and what types of options they offer for patients who are self-pay. Some doctors offer discounts or will set up payment plans for their patients who do not have insurance, but you will need to ask so you aren't  surprised when you get to your appointment.  2) Contact Your Local Health Department Not all health departments have doctors that can see patients for sick visits, but many do, so it is worth a call to see if yours does. If you don't know where your local health department is, you can check in your phone book. The CDC also has a tool to help you locate your state's health department, and many state websites also have listings of all of their local health departments.  3) Find a [facility] If your illness is not likely to be very severe or complicated, you may want to try a walk in clinic. These are popping up all over the country in pharmacies, drugstores, and shopping centers. They're usually staffed by nurse practitioners or physician assistants that have been trained to treat common illnesses and complaints. They're usually fairly quick and inexpensive. However, if you have serious medical issues or chronic medical problems, these are probably not your best option.  No Primary Care Doctor: - Call Health Connect at  [phone] - they can help you locate a primary care doctor that  accepts your insurance, provides certain services, etc. - Physician Referral Service- [phone]  Chronic Pain Problems: Organization         Address  Phone   Notes  [facility]  [phone] Patients need to be referred by their primary care doctor.   Medication Assistance: Organization         Address  Phone   Notes  [facility] Medication [facility] [address] [place], [place] [address] [phone] --Must be a resident of [facility] -- Must have NO insurance coverage whatsoever (no Medicaid/ Medicare, etc.) -- The pt. MUST have a primary care doctor that directs their care regularly and follows them in the community   MedAssist  [phone]   [employer]  [phone]    Agencies that provide inexpensive medical care: Organization          Address  Phone   Notes  [place]  [phone]   [facility]    [phone]  [facility] [place], [place] [address] [phone]   [place]. [address] [place] [phone]   Planned Parenthood    [phone]   [facility]    [phone]   [place] [address] [place] [address], [place] Phone:  [phone], Fax:  [phone] Hours of Operation:  9 am - 6 pm, M-F.  Also accepts Medicaid/Medicare and self-pay.  [facility] for [place], [address], [place] Phone: [phone], Fax: [phone]. Hours of Operation:  8:30 am - 5:30 pm, M-F.  Also accepts Medicaid and self-pay.  [facility] [place] [address], [place] Phone: [phone]   [place], [place], [place] [phone], Ext. [phone] Mondays & Thursdays: 7-9 AM.  First 15 patients are seen on a first come, first serve basis.    [place] Providers:  Organization         Address  Phone   Notes  [facility] [address], [place] [phone] Also accepts self-pay patients.  [facility] [id number] [place], [place]  [phone]   [place], [address], [place] [phone]   [facility] [address], [place] [phone]   Lucianne Lei [address], [place]   [phone] Only accepts [place] Access [place] patients after they have their name applied to their card.   Self-Pay (no insurance) in [facility]:  Organization         Address  Phone   Notes  Sickle Cell Patients, [facility] Internal Medicine [place] [phone]   [facility] [place] [phone]    [facility] [place]  [place], [place],  [phone]   [facility]/Dr. Osei-Bonsu  [address] [place] or [address], [place] [phone] Phone number for both [place] and [place] locations is the same.  [facility] and [facility] [address] [place] [phone]   [facility] [address], [place] or [address] [phone]   [facility] [address] [place] [phone], phone; [phone], fax Sees patients 1st and 3rd Saturday of every month.  Must not qualify for public or private insurance (i.e. Medicaid, Medicare, [place] Health Choice, Veterans' Benefits)  Household income should be no more than 200% of the poverty level The clinic cannot treat you if you are pregnant or think you are pregnant  Sexually transmitted diseases are not treated at the clinic.    Dental Care: Organization         Address  Phone  Notes  [facility] Department of [facility] [place] [phone] Accepts children up to age 49 who are enrolled in [place] or [place]; pregnant women with a Medicaid card; and children who have applied for Medicaid or [place] Health Choice, but were declined, whose parents can pay a reduced fee at time of service.  [facility] Department of [facility]  [address], [place] [phone] Accepts children up to age 5 who are  enrolled in Medicaid or [place] Health Choice; pregnant women with a Medicaid card; and children who have applied for Medicaid or  Health Choice, but were declined, whose parents can pay a reduced fee at time of service.  [facility] PRO[facility]  [place] [phone] Patients are seen by appointment only. Walk-ins are not accepted. [place] will see patients 9  years of age and older. Monday - Tuesday (8am-5pm) Most Wednesdays (8:30-5pm) $30 per visit, cash only  [facility] Adult Dental Access PROGRAM  [address], [facility] [phone] Patients are seen by appointment only. Walk-ins are not accepted. [place] will see patients 59 years of age and older. One Wednesday Evening (Monthly: Volunteer Based).  $30 per visit, cash only  [place]  [phone] for adults; Children under age 56, call Graduate Pediatric Dentistry at [phone]. Children aged 53-14, please call [phone] to request a pediatric application.  Dental services are provided in all areas of dental care including fillings, crowns and bridges, complete and partial dentures, implants, gum treatment, root canals, and extractions. Preventive care is also provided. Treatment is provided to both adults and children. Patients are selected via a lottery and there is often a waiting list.   [facility] [address] [place]  [phone] [email or web address]   [facility] [address] [place], [place] [phone], Ext. [phone] Second and Fourth Thursday of each month, opens at 6:30 AM; Clinic ends at 9 AM.  Patients are seen on a first-come first-served basis, and a limited number are seen during each clinic.   [facility] [place]  [address] Hillard Danker [place], [place] [phone]   Eligibility Requirements You must have lived in [place], [place], or [place] counties for at least the last three months.   You cannot be eligible for state or federal sponsored [employer], including [employer], [place], or [employer].   You generally cannot be eligible for healthcare insurance through your employer.    How to apply: Eligibility screenings are held every Tuesday and Wednesday afternoon from 1:00 pm until 4:00 pm. You do not need an appointment for the interview!  [facility]  [address], [place], [place]   [place]  [place] Department  [place]  [phone]    Behavioral Health Resources in the Community: Intensive Outpatient Programs Organization         Address  Phone  Notes  [place]. [address] [place], [place] [phone]   [facility] Outpatient [address] [place], [place]   ADS: Alcohol & Drug Svcs [address], [place], [place]   [place] [address],  [place], Center or [phone]   Substance Abuse Resources Organization         Address  Phone  Notes  Alcohol and Drug Services  [phone]   [place]  [phone]   [facility] [place]   Chinita Pester  [phone]   Residential & Outpatient Substance Abuse Program  [phone]   Psychological Services Organization         Address  Phone  Notes  [facility] [place]  [place]  [phone]   [place] [address] [place]  [phone] or [phone]    Mobile Crisis Teams Organization         Address  Phone  Notes  [facility], Mobile Crisis Care Unit  [phone]   [facility]  [address] [place], [place]   [facility] [address], [place] [phone]    Self-Help/Support Groups Organization         Address  Phone             Notes  [place]. of [place] - variety of support groups  [place] Call for more information  Narcotics Anonymous (NA), [facility] [address], [employer] [place]  2 meetings at this location   Special educational needs teacher         Address  Phone  Notes  ASAP Residential Treatment [place],    [place]   [phone]   [facility]  [address] [place] [id number], [place], [place]   [place], [place] [phone] Admissions: 8am-3pm M-F  Incentives Substance [place] [address],    [place], [place] [phone]   [facility] [address] [place], [place], [place]   [facility] [address]  [place], [place]   [facility] - Intensive Outpatient [address] Kristeen [address], [place], [place]   [facility] ([place].,  [place], [place] [phone] or [phone]   [facility] Services (RTS) [address], [place], [place] Accepts Medicaid  [facility] [place] [address]  [place] [phone] Substance Abuse/Addiction Treatment   [facility] Organization         Address  Phone  Notes  [facility]  [phone]   Domenic Schwab, PhD [address] Arlis Porta [place], [place]   [phone] or [phone]   [place], [place] [phone]   [facility] [address] [place], [place] [phone] Insurance/Medicaid/sponsorship through [facility] and Families [address], [address] [place]                                    [place], [place] [phone] [place] [address], [place] [phone]    Dr. Adele Schilder  [phone]   [facility] Dept. 1) [address] [place] 2) [place] 3)  [place] [address], [place] [phone]  [phone]   [place] [phone] or [phone] (After Hours)

## 2015-06-03 NOTE — ED Provider Notes (Signed)
CSN: [id number]     Arrival date & time 06/03/15  1424 History   First MD Initiated Contact with Patient 06/03/15 1505     Chief Complaint  Patient presents with  . Head Injury     (Consider location/radiation/quality/duration/timing/severity/associated sxs/prior Treatment) HPI Ports that she was on a chair trying to reach a cabinet and fell backwards. She struck the back of her head. She denies loss of consciousness. She reports she does have bleeding and a knot on the back of her head. Patient poor she does have a pretty severe headache. No blood thinners. No visual changes. No cervical neck pain. No weakness numbness or tinging to extremities. No nausea vomiting or visual changes. Past Medical History  Diagnosis Date  . Hypertension   . High cholesterol   . Neuralgia, neuritis, and radiculitis, unspecified 10/14/2013  . Ulcer 10/2013  . Depression    Past Surgical History  Procedure Laterality Date  . Elbow arthroplasty      2007   Family History  Problem Relation Age of Onset  . Heart attack Mother   . Hypertension Mother   . Aneurysm Father   . Hypertension Father    Social History  Substance Use Topics  . Smoking status: Former Research scientist (life sciences)  . Smokeless tobacco: Never Used     Comment: Quit 25 years ago.  . Alcohol Use: 1.8 oz/week    3 Glasses of wine per week     Comment: daily with dinner   OB History    No data available     Review of Systems  10 Systems reviewed and are negative for acute change except as noted in the HPI.   Allergies  Review of patient's allergies indicates no known allergies.  Home Medications   Prior to Admission medications   Medication Sig Start Date End Date Taking? Authorizing Provider  CLONIDINE HCL PO Take by mouth.   Yes Historical Provider, MD  atorvastatin (LIPITOR) 20 MG tablet 20 mg daily. 08/04/14   Historical Provider, MD  carvedilol (COREG) 6.25 MG tablet Take 6.25 mg by mouth 2 (two) times daily with a meal.     Historical Provider, MD  gabapentin (NEURONTIN) 100 MG capsule TAKE 1 CAPSULE (100 MG TOTAL) BY MOUTH 3 (THREE) TIMES DAILY. 12/06/14   Ward Givens, NP  omeprazole (PRILOSEC) 40 MG capsule 40 mg daily. 08/08/14   Historical Provider, MD  Sertraline HCl (ZOLOFT PO) Take by mouth.    Historical Provider, MD  traZODone (DESYREL) 50 MG tablet 50 mg as needed. 08/30/14   Historical Provider, MD  triamterene-hydrochlorothiazide (DYAZIDE) 37.5-25 MG per capsule Take 1 capsule by mouth daily.    Historical Provider, MD  zolpidem (AMBIEN) 5 MG tablet Take 5 mg by mouth at bedtime as needed for sleep.    Historical Provider, MD   BP 137/83 mmHg  Pulse 87  Temp(Src) 98 F (36.7 C) (Oral)  Resp 18  Ht 5\' 3"  (1.6 m)  Wt 130 lb (58.968 kg)  BMI 23.03 kg/m2  SpO2 100% Physical Exam  Constitutional: She is oriented to person, place, and time. She appears well-developed and well-nourished.  HENT:  Right Ear: External ear normal.  Left Ear: External ear normal.  Mouth/Throat: Oropharynx is clear and moist.  3 cm hematoma to posterior scalp. Not actively bleeding at this time. Laceration present.  Eyes: EOM are normal. Pupils are equal, round, and reactive to light.  Neck: Neck supple.  No C-spine to palpation.  Cardiovascular: Normal rate, regular  rhythm, normal heart sounds and intact distal pulses.   Pulmonary/Chest: Effort normal and breath sounds normal. She exhibits no tenderness.  Abdominal: Soft. Bowel sounds are normal. She exhibits no distension. There is no tenderness.  Musculoskeletal: Normal range of motion. She exhibits no edema.  Neurological: She is alert and oriented to person, place, and time. She has normal strength. No cranial nerve deficit. She exhibits normal muscle tone. Coordination normal. GCS eye subscore is 4. GCS verbal subscore is 5. GCS motor subscore is 6.  Skin: Skin is warm, dry and intact.  Psychiatric: She has a normal mood and affect.    ED Course  Procedures  (including critical care time) LACERATION REPAIR Performed by: Charlesetta Shanks Authorized by: Charlesetta Shanks Consent: Verbal consent obtained. Risks and benefits: risks, benefits and alternatives were discussed Consent given by: patient Patient identity confirmed: provided demographic data Prepped and Draped in normal sterile fashion Wound explored  Laceration Location: Posterior scalp  Laceration Length: 2 cm  No Foreign Bodies seen or palpated  Anesthesia: local infiltration  Local anesthetic: lidocaine 2% w epinephrine  Anesthetic total: 2  ml  Irrigation method: syringe Amount of cleaning: standard  Skin closure: Laceration through dermis. Extensive cleaning.   Number of sutures:  Technique: 3 staples.   Patient tolerance: Patient tolerated the procedure well with no immediate complications. Labs Review Labs Reviewed - No data to display  Imaging Review Ct Head Wo Contrast  06/03/2015  CLINICAL DATA:  Fall, head trauma, laceration EXAM: CT HEAD WITHOUT CONTRAST TECHNIQUE: Contiguous axial images were obtained from the base of the skull through the vertex without contrast. COMPARISON:  05/14/2015 FINDINGS: No acute intracranial hemorrhage, mass lesion, definite infarction, midline shift, herniation, or hydrocephalus. Normal gray-white matter differentiation. No focal mass effect or edema. Cisterns are patent. No cerebellar abnormality. Small midline posterior scalp hematoma, image 21. No underlying skull fracture. Mastoids and sinuses remain clear. IMPRESSION: No acute intracranial abnormality.  Small posterior scalp hematoma. Electronically Signed   By: Jerilynn Mages.  Shick M.D.   On: 06/03/2015 15:55   I have personally reviewed and evaluated these images and lab results as part of my medical decision-making.   EKG Interpretation None      MDM   Final diagnoses:  Fall, initial encounter  Head injury, initial encounter  Scalp laceration, initial encounter   Patient  with fall was mechanical in nature. Posterior scalp laceration repaired. CT head obtained with no intracranial injury. It is well in appearance. She is alert and nontoxic. She requests information for alcohol use. She reports streaking about 6-7 beers a day. She reports this started for pain treatment and number of years ago and now she feels that she needs to address. Patient shows no signs of acute alcohol intoxication. She is well-groomed and does not smell of alcohol. No suicidal or homicidal ideations. Resources are provided on discharge instruction as well as counseling for discuss this with her family physician in a consult placed for social work.    Charlesetta Shanks, MD 06/03/15 [phone]

## 2015-06-03 NOTE — ED Notes (Signed)
She was standing on a chair and fell off hitting the back of her head on a wooden cabinet. Laceration to the back of her head. Bleeding controlled.

## 2017-03-31 IMAGING — CT CT HEAD W/O CM
1 series · 16 of 30 positions shown, 20 images · non-contrast
Comparison: None.

CLINICAL DATA: Fall in kitchen 1 hour ago striking left occipital
region.

EXAM:
CT HEAD WITHOUT CONTRAST
TECHNIQUE: Contiguous axial images were obtained from the base of the skull
through the vertex without intravenous contrast.

[Series 2: head 4.8 h37s · axial · 0.45mm/px · z∈[-155,-18]mm · 16 of 32 slices shown, 20 images]
[im 2/32  brain]
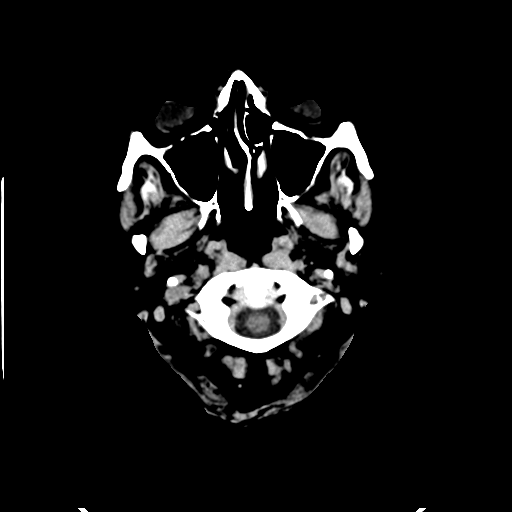
[im 2/32  bone]
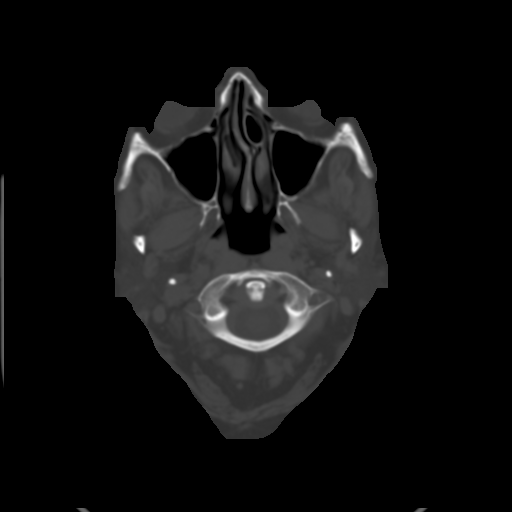
[im 4/32  brain]
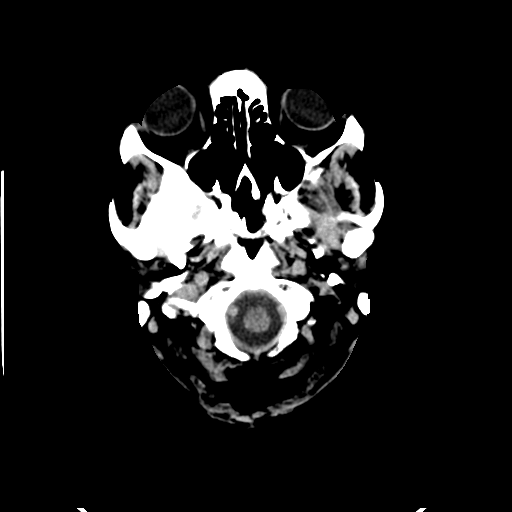
[im 6/32  brain]
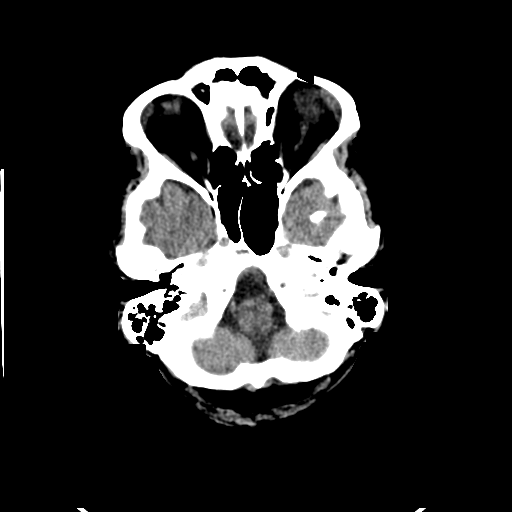
[im 8/32  brain]
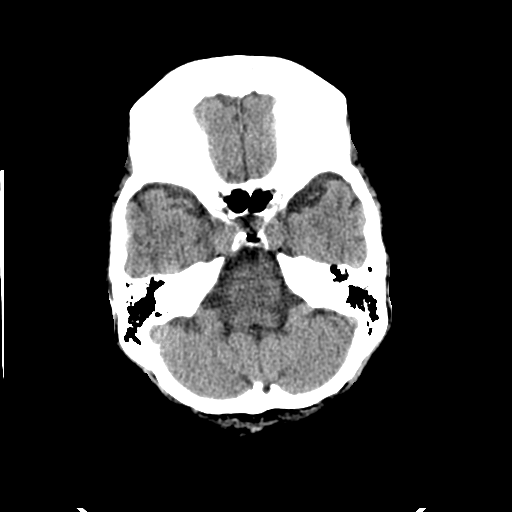
[im 9/32  brain]
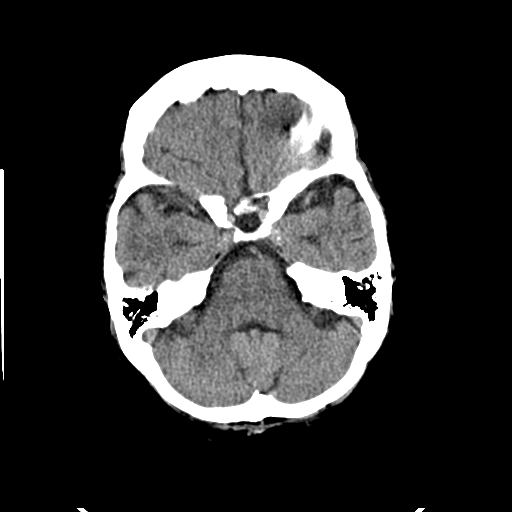
[im 9/32  bone]
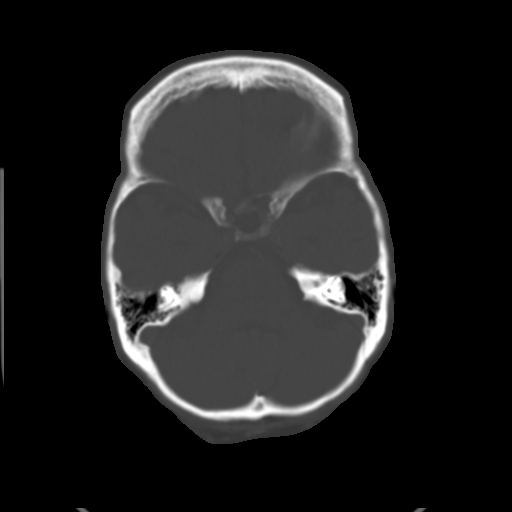
[im 11/32  brain]
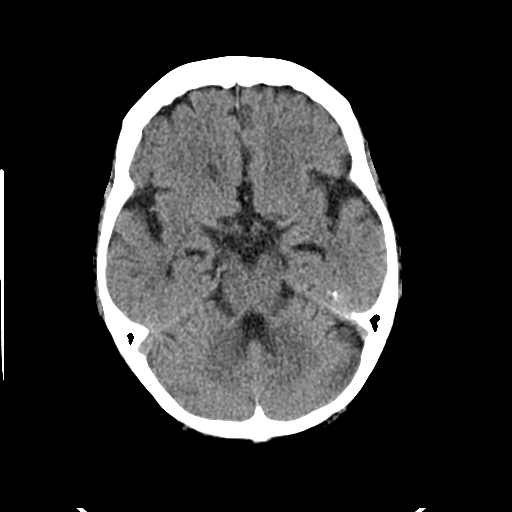
[im 13/32  brain]
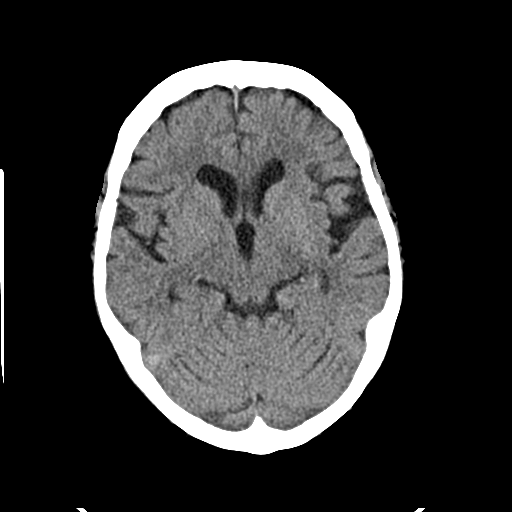
[im 15/32  brain]
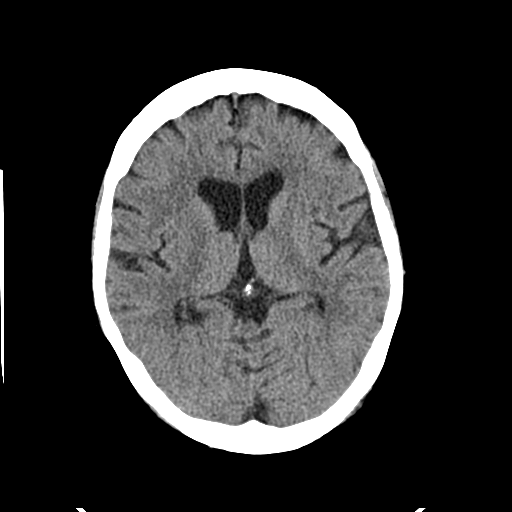
[im 17/32  brain]
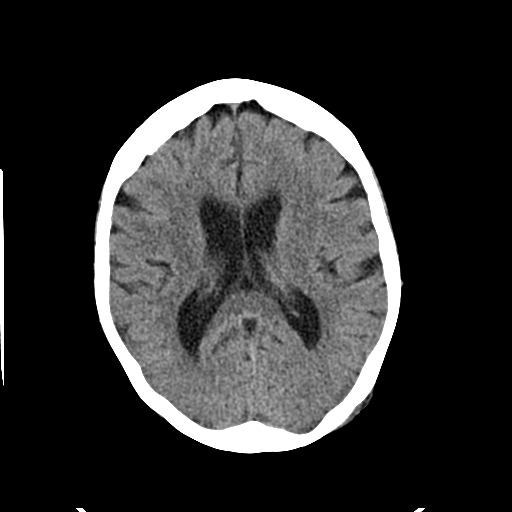
[im 17/32  bone]
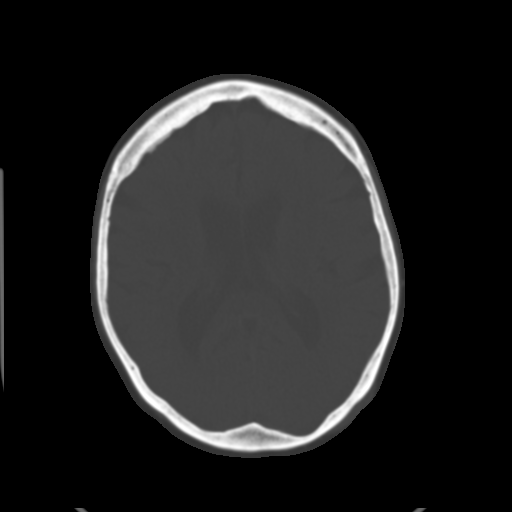
[im 19/32  brain]
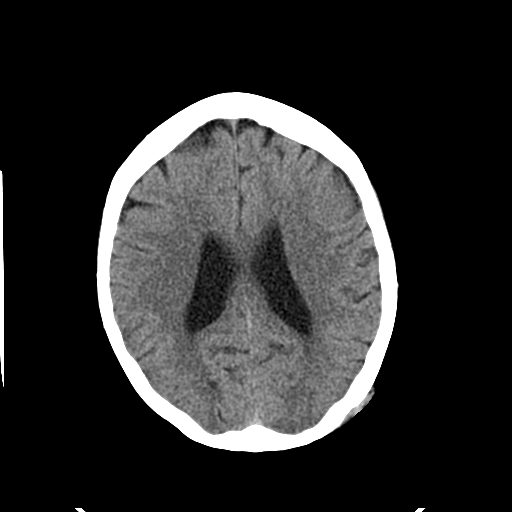
[im 21/32  brain]
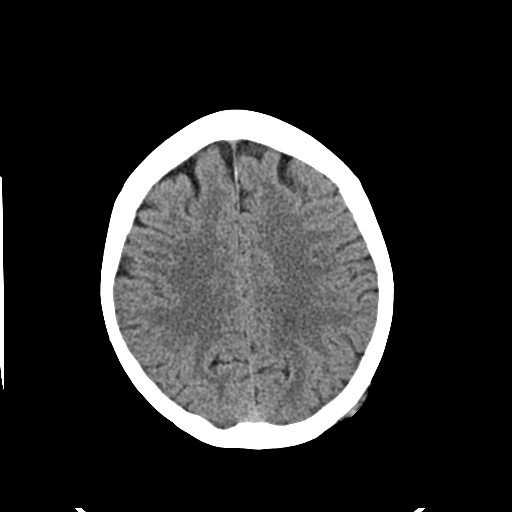
[im 23/32  brain]
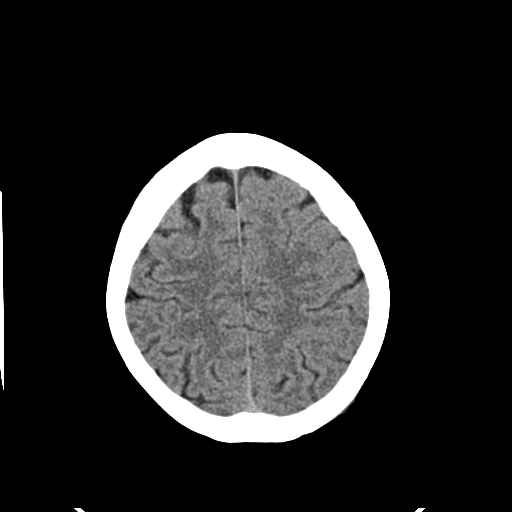
[im 24/32  brain]
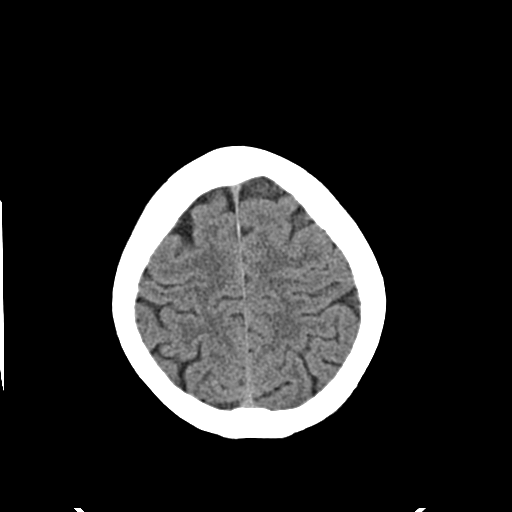
[im 24/32  bone]
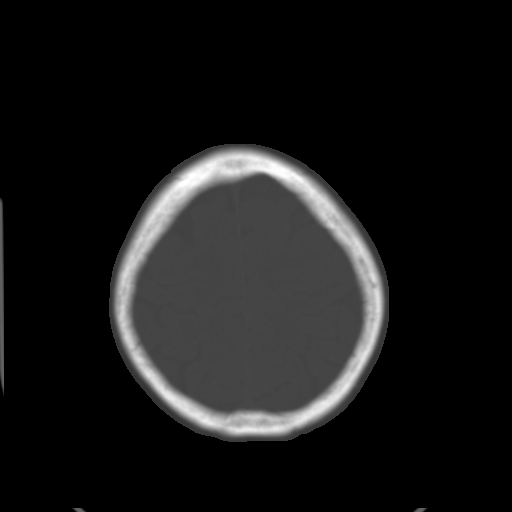
[im 26/32  brain]
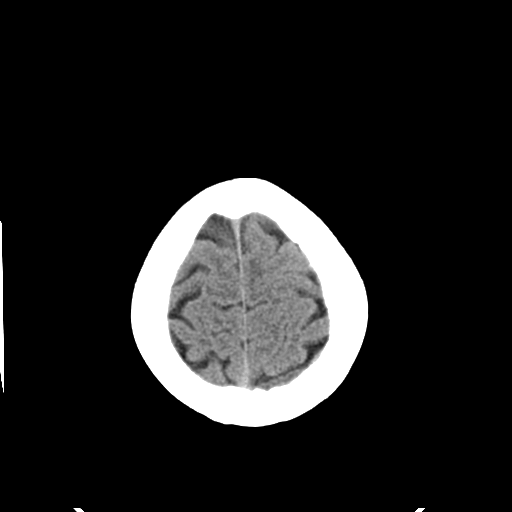
[im 28/32  brain]
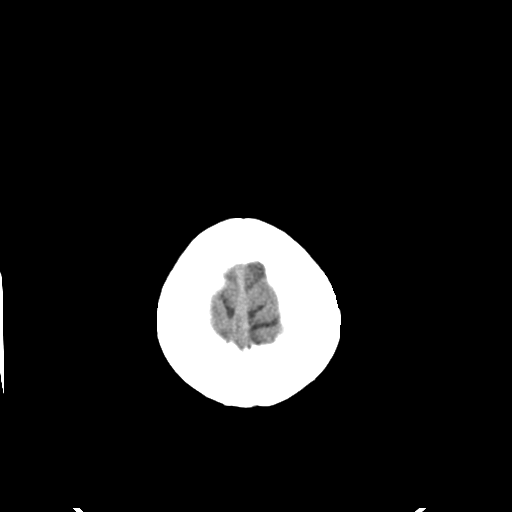
[im 30/32  brain]
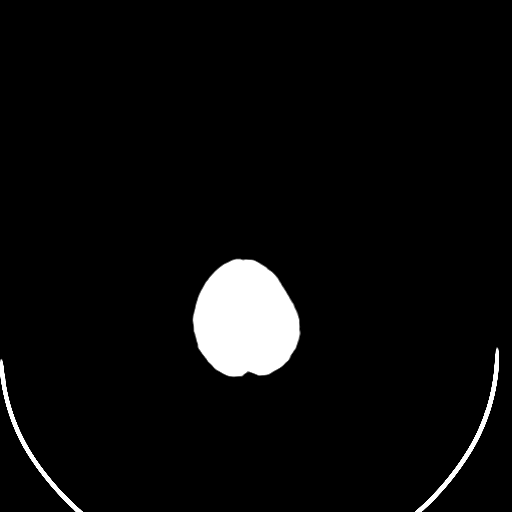

[16 of 30 positions shown; findings below may reference images not displayed]

FINDINGS: Ventricles, cisterns and other CSF spaces are within normal. There
is no mass, mass effect, shift of midline structures or acute
hemorrhage. There is no evidence of acute infarction. Small left
posterior parietal scalp contusion. No fracture.
IMPRESSION: No acute intracranial findings.

Small left posterior parietal scalp contusion.  No fracture.

## 2017-04-20 IMAGING — CT CT HEAD W/O CM
1 series · 16 of 28 positions shown, 20 images · non-contrast
Comparison: 05/14/2015

CLINICAL DATA: Fall, head trauma, laceration

EXAM:
CT HEAD WITHOUT CONTRAST
TECHNIQUE: Contiguous axial images were obtained from the base of the skull
through the vertex without contrast.

[Series 2: head 4.8 h37s · axial · 0.44mm/px · z∈[-116,+6]mm · 16 of 28 slices shown, 20 images]
[im 2/28  brain]
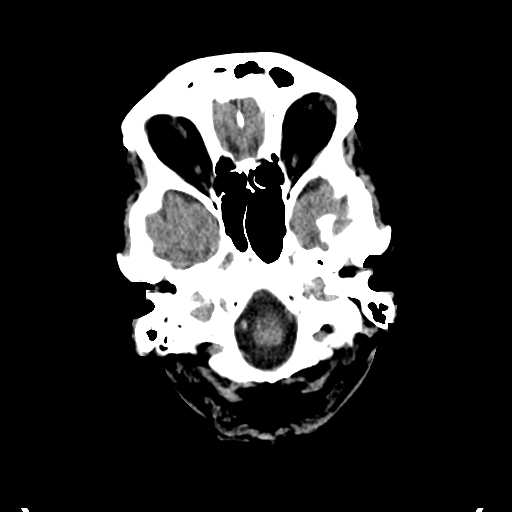
[im 2/28  bone]
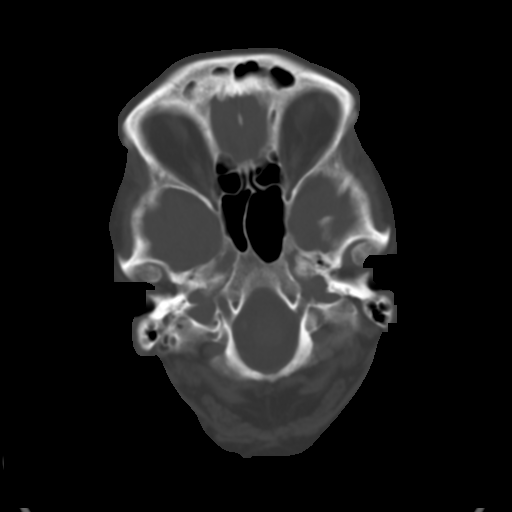
[im 4/28  brain]
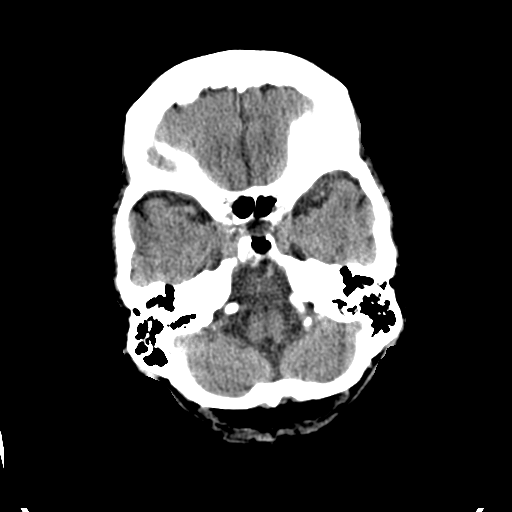
[im 6/28  brain]
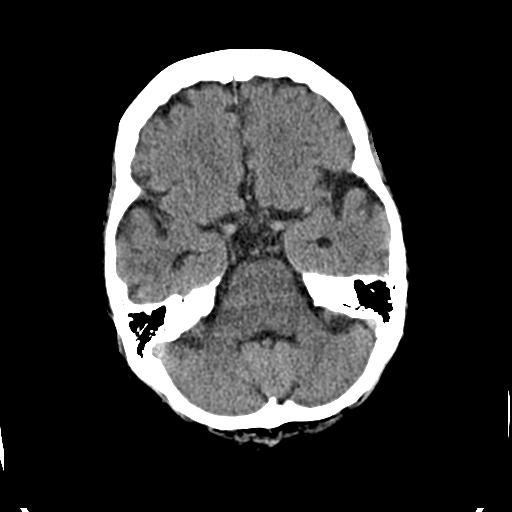
[im 7/28  brain]
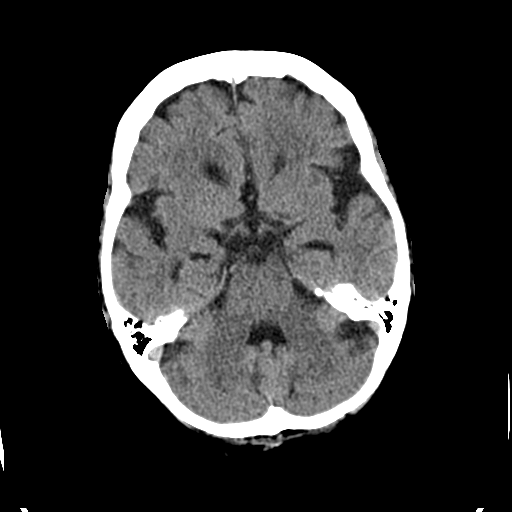
[im 9/28  brain]
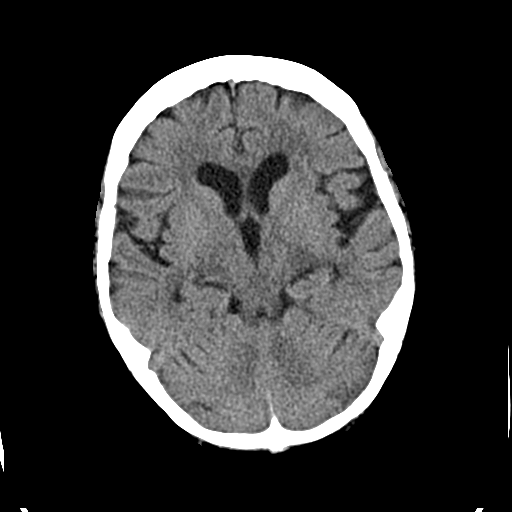
[im 9/28  bone]
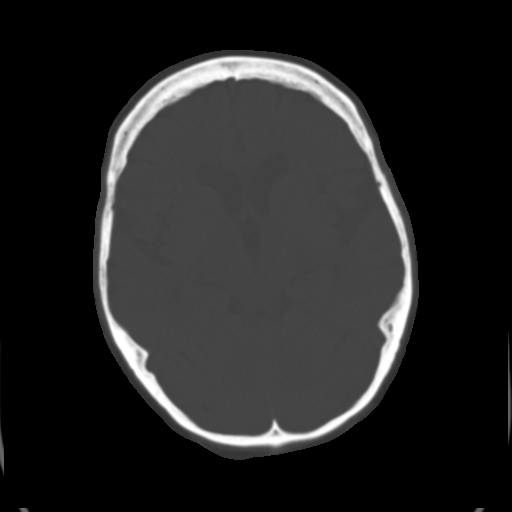
[im 10/28  brain]
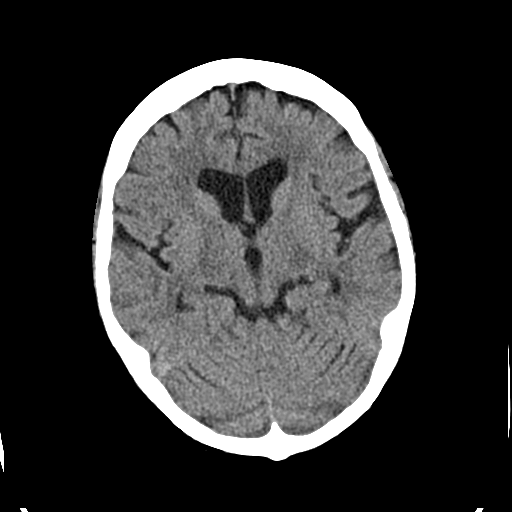
[im 12/28  brain]
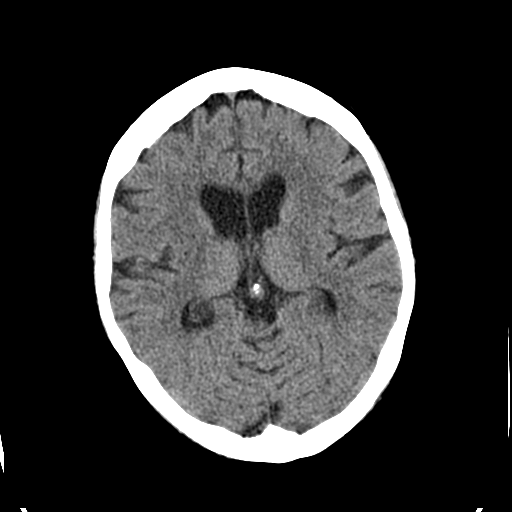
[im 14/28  brain]
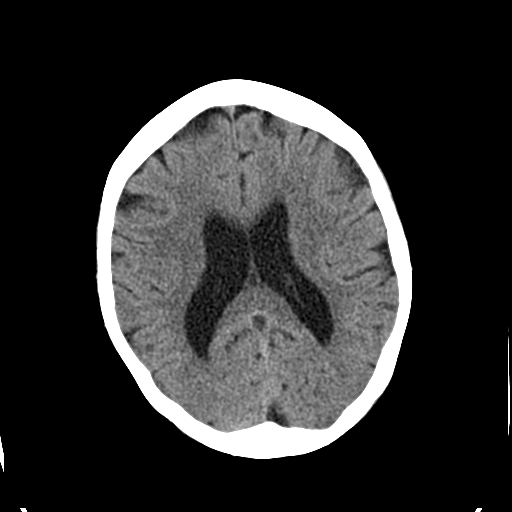
[im 15/28  brain]
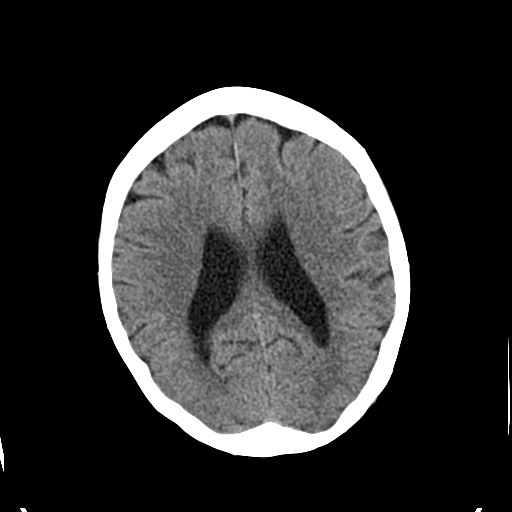
[im 15/28  bone]
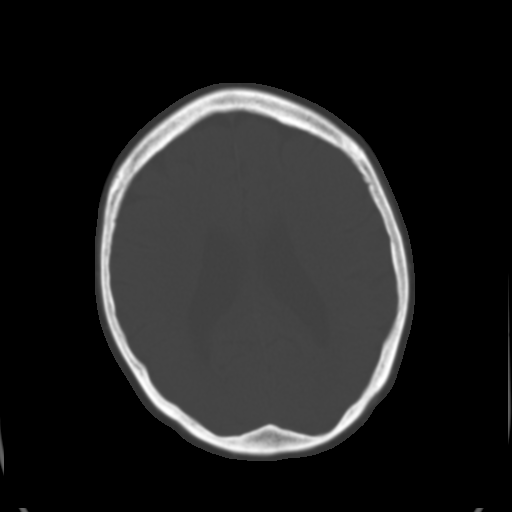
[im 17/28  brain]
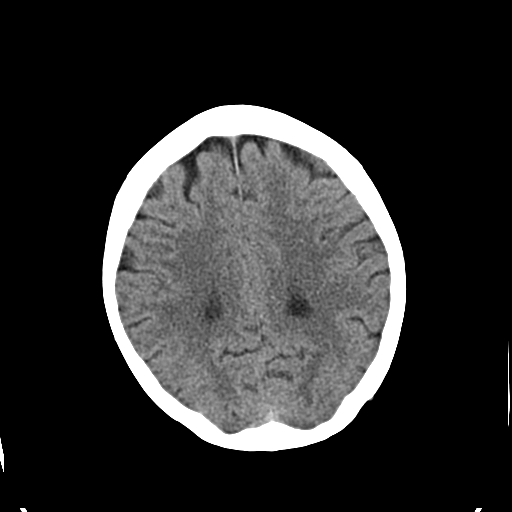
[im 19/28  brain]
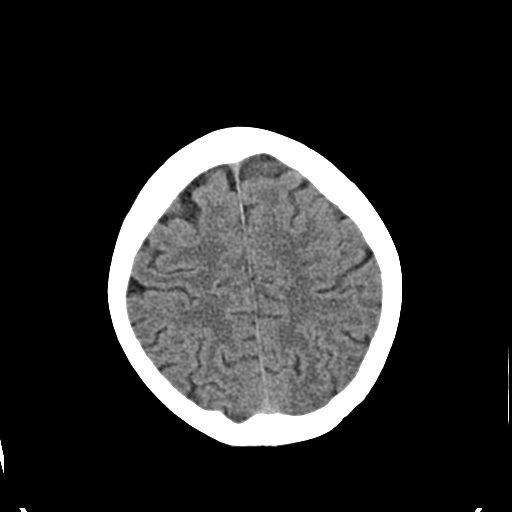
[im 20/28  brain]
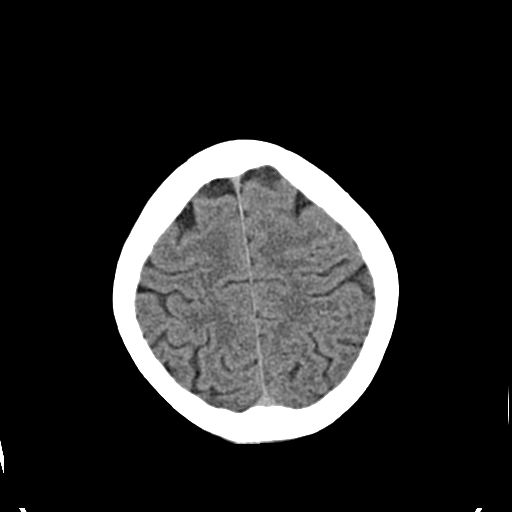
[im 22/28  brain]
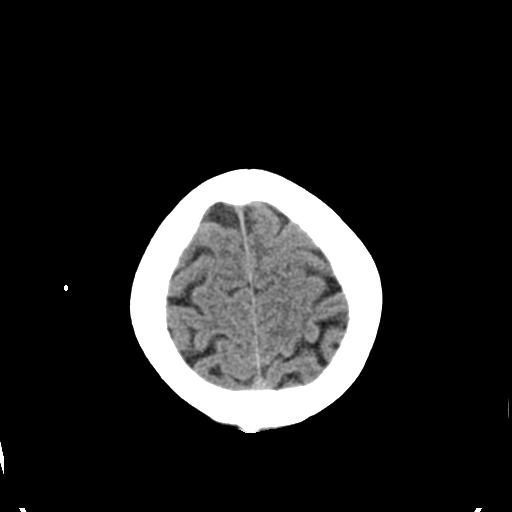
[im 22/28  bone]
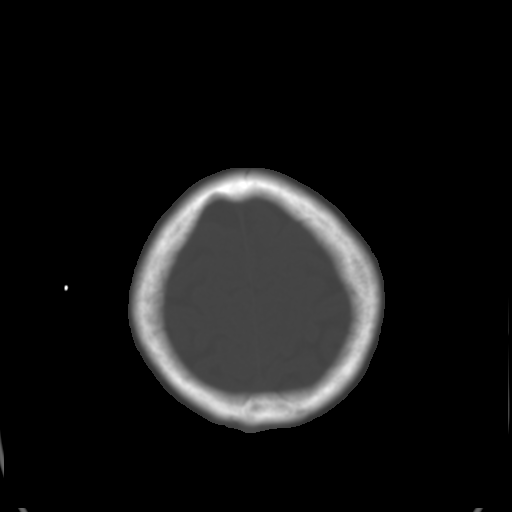
[im 23/28  brain]
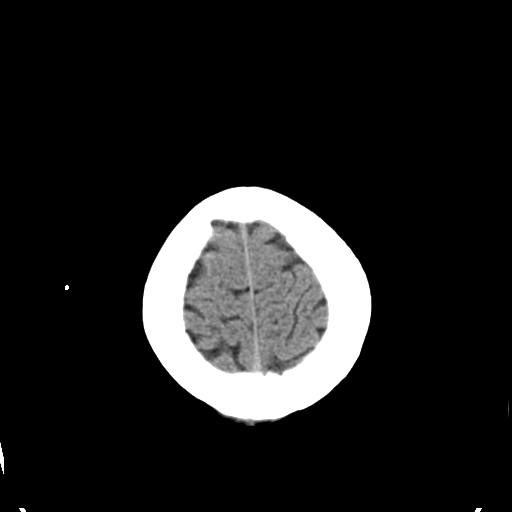
[im 25/28  brain]
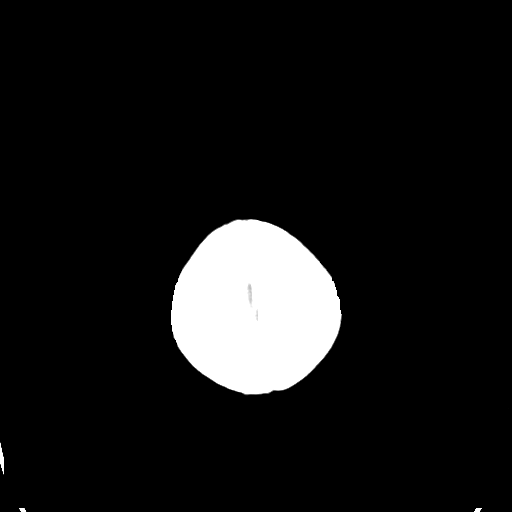
[im 27/28  brain]
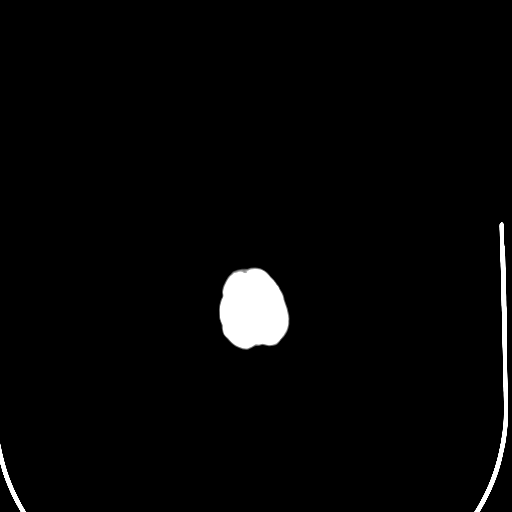

[16 of 28 positions shown; findings below may reference images not displayed]

FINDINGS: No acute intracranial hemorrhage, mass lesion, definite infarction,
midline shift, herniation, or hydrocephalus. Normal gray-white
matter differentiation. No focal mass effect or edema. Cisterns are
patent. No cerebellar abnormality. Small midline posterior scalp
hematoma, image 21. No underlying skull fracture. Mastoids and
sinuses remain clear.
IMPRESSION: No acute intracranial abnormality.  Small posterior scalp hematoma.

## 2017-08-09 DIAGNOSIS — J018 Other acute sinusitis: Secondary | ICD-10-CM | POA: Diagnosis not present

## 2017-09-18 DIAGNOSIS — I10 Essential (primary) hypertension: Secondary | ICD-10-CM | POA: Diagnosis not present

## 2017-09-18 DIAGNOSIS — E7800 Pure hypercholesterolemia, unspecified: Secondary | ICD-10-CM | POA: Diagnosis not present

## 2017-09-18 DIAGNOSIS — Z79899 Other long term (current) drug therapy: Secondary | ICD-10-CM | POA: Diagnosis not present

## 2017-09-18 DIAGNOSIS — K219 Gastro-esophageal reflux disease without esophagitis: Secondary | ICD-10-CM | POA: Diagnosis not present

## 2017-10-09 DIAGNOSIS — Z1231 Encounter for screening mammogram for malignant neoplasm of breast: Secondary | ICD-10-CM | POA: Diagnosis not present

## 2018-01-01 DIAGNOSIS — R1084 Generalized abdominal pain: Secondary | ICD-10-CM | POA: Diagnosis not present

## 2018-01-01 DIAGNOSIS — E7800 Pure hypercholesterolemia, unspecified: Secondary | ICD-10-CM | POA: Diagnosis not present

## 2018-01-01 DIAGNOSIS — R0989 Other specified symptoms and signs involving the circulatory and respiratory systems: Secondary | ICD-10-CM | POA: Diagnosis not present

## 2018-01-01 DIAGNOSIS — I10 Essential (primary) hypertension: Secondary | ICD-10-CM | POA: Diagnosis not present

## 2018-01-01 DIAGNOSIS — Z79899 Other long term (current) drug therapy: Secondary | ICD-10-CM | POA: Diagnosis not present

## 2018-01-01 DIAGNOSIS — K219 Gastro-esophageal reflux disease without esophagitis: Secondary | ICD-10-CM | POA: Diagnosis not present

## 2018-03-28 DIAGNOSIS — I10 Essential (primary) hypertension: Secondary | ICD-10-CM | POA: Diagnosis not present

## 2018-03-28 DIAGNOSIS — M545 Low back pain: Secondary | ICD-10-CM | POA: Diagnosis not present

## 2018-03-28 DIAGNOSIS — E782 Mixed hyperlipidemia: Secondary | ICD-10-CM | POA: Diagnosis not present

## 2018-03-28 DIAGNOSIS — J018 Other acute sinusitis: Secondary | ICD-10-CM | POA: Diagnosis not present

## 2018-07-09 DIAGNOSIS — Z1331 Encounter for screening for depression: Secondary | ICD-10-CM | POA: Diagnosis not present

## 2018-07-09 DIAGNOSIS — R0602 Shortness of breath: Secondary | ICD-10-CM | POA: Diagnosis not present

## 2018-07-09 DIAGNOSIS — Z1339 Encounter for screening examination for other mental health and behavioral disorders: Secondary | ICD-10-CM | POA: Diagnosis not present

## 2018-07-09 DIAGNOSIS — Z0000 Encounter for general adult medical examination without abnormal findings: Secondary | ICD-10-CM | POA: Diagnosis not present

## 2018-07-09 DIAGNOSIS — I10 Essential (primary) hypertension: Secondary | ICD-10-CM | POA: Diagnosis not present

## 2018-07-09 DIAGNOSIS — Z79899 Other long term (current) drug therapy: Secondary | ICD-10-CM | POA: Diagnosis not present

## 2018-07-09 DIAGNOSIS — E7800 Pure hypercholesterolemia, unspecified: Secondary | ICD-10-CM | POA: Diagnosis not present

## 2018-07-09 DIAGNOSIS — Z23 Encounter for immunization: Secondary | ICD-10-CM | POA: Diagnosis not present

## 2018-08-28 DIAGNOSIS — R05 Cough: Secondary | ICD-10-CM | POA: Diagnosis not present

## 2018-08-28 DIAGNOSIS — J018 Other acute sinusitis: Secondary | ICD-10-CM | POA: Diagnosis not present

## 2018-11-18 DIAGNOSIS — Z1231 Encounter for screening mammogram for malignant neoplasm of breast: Secondary | ICD-10-CM | POA: Diagnosis not present

## 2018-11-20 DIAGNOSIS — H2513 Age-related nuclear cataract, bilateral: Secondary | ICD-10-CM | POA: Diagnosis not present

## 2018-12-17 DIAGNOSIS — I10 Essential (primary) hypertension: Secondary | ICD-10-CM | POA: Diagnosis not present

## 2018-12-17 DIAGNOSIS — K219 Gastro-esophageal reflux disease without esophagitis: Secondary | ICD-10-CM | POA: Diagnosis not present

## 2018-12-17 DIAGNOSIS — E7800 Pure hypercholesterolemia, unspecified: Secondary | ICD-10-CM | POA: Diagnosis not present

## 2018-12-17 DIAGNOSIS — Z1159 Encounter for screening for other viral diseases: Secondary | ICD-10-CM | POA: Diagnosis not present

## 2018-12-17 DIAGNOSIS — Z79899 Other long term (current) drug therapy: Secondary | ICD-10-CM | POA: Diagnosis not present

## 2019-01-21 DIAGNOSIS — Z780 Asymptomatic menopausal state: Secondary | ICD-10-CM | POA: Diagnosis not present

## 2019-02-18 DIAGNOSIS — D485 Neoplasm of uncertain behavior of skin: Secondary | ICD-10-CM | POA: Diagnosis not present

## 2019-02-18 DIAGNOSIS — L820 Inflamed seborrheic keratosis: Secondary | ICD-10-CM | POA: Diagnosis not present

## 2019-02-18 DIAGNOSIS — L700 Acne vulgaris: Secondary | ICD-10-CM | POA: Diagnosis not present

## 2019-04-17 DIAGNOSIS — Z23 Encounter for immunization: Secondary | ICD-10-CM | POA: Diagnosis not present

## 2019-04-17 DIAGNOSIS — E559 Vitamin D deficiency, unspecified: Secondary | ICD-10-CM | POA: Diagnosis not present

## 2019-04-17 DIAGNOSIS — Z1159 Encounter for screening for other viral diseases: Secondary | ICD-10-CM | POA: Diagnosis not present

## 2019-04-17 DIAGNOSIS — E7800 Pure hypercholesterolemia, unspecified: Secondary | ICD-10-CM | POA: Diagnosis not present

## 2019-04-17 DIAGNOSIS — I10 Essential (primary) hypertension: Secondary | ICD-10-CM | POA: Diagnosis not present

## 2019-04-17 DIAGNOSIS — Z79899 Other long term (current) drug therapy: Secondary | ICD-10-CM | POA: Diagnosis not present

## 2019-06-09 DIAGNOSIS — R509 Fever, unspecified: Secondary | ICD-10-CM | POA: Diagnosis not present

## 2019-06-09 DIAGNOSIS — Z9189 Other specified personal risk factors, not elsewhere classified: Secondary | ICD-10-CM | POA: Diagnosis not present

## 2019-06-09 DIAGNOSIS — J111 Influenza due to unidentified influenza virus with other respiratory manifestations: Secondary | ICD-10-CM | POA: Diagnosis not present

## 2019-06-09 DIAGNOSIS — Z20828 Contact with and (suspected) exposure to other viral communicable diseases: Secondary | ICD-10-CM | POA: Diagnosis not present

## 2019-06-25 DIAGNOSIS — Z20828 Contact with and (suspected) exposure to other viral communicable diseases: Secondary | ICD-10-CM | POA: Diagnosis not present

## 2019-08-19 DIAGNOSIS — E039 Hypothyroidism, unspecified: Secondary | ICD-10-CM | POA: Diagnosis not present

## 2019-08-19 DIAGNOSIS — Z20822 Contact with and (suspected) exposure to covid-19: Secondary | ICD-10-CM | POA: Diagnosis not present

## 2019-08-19 DIAGNOSIS — E7800 Pure hypercholesterolemia, unspecified: Secondary | ICD-10-CM | POA: Diagnosis not present

## 2019-08-19 DIAGNOSIS — Z1331 Encounter for screening for depression: Secondary | ICD-10-CM | POA: Diagnosis not present

## 2019-08-19 DIAGNOSIS — E559 Vitamin D deficiency, unspecified: Secondary | ICD-10-CM | POA: Diagnosis not present

## 2019-08-19 DIAGNOSIS — Z0000 Encounter for general adult medical examination without abnormal findings: Secondary | ICD-10-CM | POA: Diagnosis not present

## 2019-08-19 DIAGNOSIS — I10 Essential (primary) hypertension: Secondary | ICD-10-CM | POA: Diagnosis not present

## 2019-08-19 DIAGNOSIS — R0602 Shortness of breath: Secondary | ICD-10-CM | POA: Diagnosis not present

## 2019-08-19 DIAGNOSIS — Z1339 Encounter for screening examination for other mental health and behavioral disorders: Secondary | ICD-10-CM | POA: Diagnosis not present

## 2019-08-19 DIAGNOSIS — Z79899 Other long term (current) drug therapy: Secondary | ICD-10-CM | POA: Diagnosis not present

## 2019-10-08 DIAGNOSIS — H2513 Age-related nuclear cataract, bilateral: Secondary | ICD-10-CM | POA: Diagnosis not present

## 2019-11-26 DIAGNOSIS — Z1231 Encounter for screening mammogram for malignant neoplasm of breast: Secondary | ICD-10-CM | POA: Diagnosis not present

## 2019-11-28 DIAGNOSIS — I10 Essential (primary) hypertension: Secondary | ICD-10-CM | POA: Diagnosis not present

## 2019-11-28 DIAGNOSIS — R739 Hyperglycemia, unspecified: Secondary | ICD-10-CM | POA: Diagnosis not present

## 2019-11-28 DIAGNOSIS — Z1159 Encounter for screening for other viral diseases: Secondary | ICD-10-CM | POA: Diagnosis not present

## 2019-11-28 DIAGNOSIS — Z79899 Other long term (current) drug therapy: Secondary | ICD-10-CM | POA: Diagnosis not present

## 2019-11-28 DIAGNOSIS — E7800 Pure hypercholesterolemia, unspecified: Secondary | ICD-10-CM | POA: Diagnosis not present

## 2020-03-05 DIAGNOSIS — E7800 Pure hypercholesterolemia, unspecified: Secondary | ICD-10-CM | POA: Diagnosis not present

## 2020-03-05 DIAGNOSIS — Z79899 Other long term (current) drug therapy: Secondary | ICD-10-CM | POA: Diagnosis not present

## 2020-03-05 DIAGNOSIS — I10 Essential (primary) hypertension: Secondary | ICD-10-CM | POA: Diagnosis not present

## 2020-03-05 DIAGNOSIS — Z1159 Encounter for screening for other viral diseases: Secondary | ICD-10-CM | POA: Diagnosis not present

## 2020-03-30 DIAGNOSIS — L089 Local infection of the skin and subcutaneous tissue, unspecified: Secondary | ICD-10-CM | POA: Diagnosis not present

## 2020-03-30 DIAGNOSIS — L729 Follicular cyst of the skin and subcutaneous tissue, unspecified: Secondary | ICD-10-CM | POA: Diagnosis not present

## 2020-03-30 DIAGNOSIS — M1811 Unilateral primary osteoarthritis of first carpometacarpal joint, right hand: Secondary | ICD-10-CM | POA: Diagnosis not present

## 2020-03-30 DIAGNOSIS — Z23 Encounter for immunization: Secondary | ICD-10-CM | POA: Diagnosis not present

## 2020-05-06 DIAGNOSIS — N390 Urinary tract infection, site not specified: Secondary | ICD-10-CM | POA: Diagnosis not present

## 2020-07-13 DIAGNOSIS — I10 Essential (primary) hypertension: Secondary | ICD-10-CM | POA: Diagnosis not present

## 2020-07-13 DIAGNOSIS — Z79899 Other long term (current) drug therapy: Secondary | ICD-10-CM | POA: Diagnosis not present

## 2020-07-13 DIAGNOSIS — Z0000 Encounter for general adult medical examination without abnormal findings: Secondary | ICD-10-CM | POA: Diagnosis not present

## 2020-07-13 DIAGNOSIS — Z1159 Encounter for screening for other viral diseases: Secondary | ICD-10-CM | POA: Diagnosis not present

## 2020-07-13 DIAGNOSIS — E7800 Pure hypercholesterolemia, unspecified: Secondary | ICD-10-CM | POA: Diagnosis not present

## 2020-10-12 DIAGNOSIS — Z760 Encounter for issue of repeat prescription: Secondary | ICD-10-CM | POA: Diagnosis not present

## 2020-10-12 DIAGNOSIS — R3129 Other microscopic hematuria: Secondary | ICD-10-CM | POA: Diagnosis not present

## 2020-10-12 DIAGNOSIS — R0602 Shortness of breath: Secondary | ICD-10-CM | POA: Diagnosis not present

## 2020-10-12 DIAGNOSIS — Z1159 Encounter for screening for other viral diseases: Secondary | ICD-10-CM | POA: Diagnosis not present

## 2020-10-12 DIAGNOSIS — Z6821 Body mass index (BMI) 21.0-21.9, adult: Secondary | ICD-10-CM | POA: Diagnosis not present

## 2020-10-12 DIAGNOSIS — I10 Essential (primary) hypertension: Secondary | ICD-10-CM | POA: Diagnosis not present

## 2020-10-12 DIAGNOSIS — Z79899 Other long term (current) drug therapy: Secondary | ICD-10-CM | POA: Diagnosis not present

## 2020-10-12 DIAGNOSIS — E7800 Pure hypercholesterolemia, unspecified: Secondary | ICD-10-CM | POA: Diagnosis not present

## 2020-10-12 DIAGNOSIS — R399 Unspecified symptoms and signs involving the genitourinary system: Secondary | ICD-10-CM | POA: Diagnosis not present

## 2020-11-02 DIAGNOSIS — R9431 Abnormal electrocardiogram [ECG] [EKG]: Secondary | ICD-10-CM | POA: Diagnosis not present

## 2020-11-02 DIAGNOSIS — R0602 Shortness of breath: Secondary | ICD-10-CM | POA: Diagnosis not present

## 2020-11-09 DIAGNOSIS — R9431 Abnormal electrocardiogram [ECG] [EKG]: Secondary | ICD-10-CM | POA: Diagnosis not present

## 2020-11-15 DIAGNOSIS — I10 Essential (primary) hypertension: Secondary | ICD-10-CM | POA: Diagnosis not present

## 2020-11-15 DIAGNOSIS — R3129 Other microscopic hematuria: Secondary | ICD-10-CM | POA: Diagnosis not present

## 2020-11-15 DIAGNOSIS — Z6822 Body mass index (BMI) 22.0-22.9, adult: Secondary | ICD-10-CM | POA: Diagnosis not present

## 2020-11-18 DIAGNOSIS — R9431 Abnormal electrocardiogram [ECG] [EKG]: Secondary | ICD-10-CM | POA: Diagnosis not present

## 2020-11-22 DIAGNOSIS — M79604 Pain in right leg: Secondary | ICD-10-CM | POA: Diagnosis not present

## 2020-11-22 DIAGNOSIS — R0989 Other specified symptoms and signs involving the circulatory and respiratory systems: Secondary | ICD-10-CM | POA: Diagnosis not present

## 2020-11-22 DIAGNOSIS — M79605 Pain in left leg: Secondary | ICD-10-CM | POA: Diagnosis not present

## 2020-11-22 DIAGNOSIS — R9431 Abnormal electrocardiogram [ECG] [EKG]: Secondary | ICD-10-CM | POA: Diagnosis not present

## 2020-11-26 DIAGNOSIS — Z1231 Encounter for screening mammogram for malignant neoplasm of breast: Secondary | ICD-10-CM | POA: Diagnosis not present

## 2021-02-18 DIAGNOSIS — Z1159 Encounter for screening for other viral diseases: Secondary | ICD-10-CM | POA: Diagnosis not present

## 2021-02-18 DIAGNOSIS — Z6822 Body mass index (BMI) 22.0-22.9, adult: Secondary | ICD-10-CM | POA: Diagnosis not present

## 2021-02-18 DIAGNOSIS — E7800 Pure hypercholesterolemia, unspecified: Secondary | ICD-10-CM | POA: Diagnosis not present

## 2021-02-18 DIAGNOSIS — E559 Vitamin D deficiency, unspecified: Secondary | ICD-10-CM | POA: Diagnosis not present

## 2021-02-18 DIAGNOSIS — I10 Essential (primary) hypertension: Secondary | ICD-10-CM | POA: Diagnosis not present

## 2021-02-18 DIAGNOSIS — Z79899 Other long term (current) drug therapy: Secondary | ICD-10-CM | POA: Diagnosis not present

## 2021-06-06 DIAGNOSIS — Z20822 Contact with and (suspected) exposure to covid-19: Secondary | ICD-10-CM | POA: Diagnosis not present

## 2021-06-06 DIAGNOSIS — Z79899 Other long term (current) drug therapy: Secondary | ICD-10-CM | POA: Diagnosis not present

## 2021-06-06 DIAGNOSIS — E559 Vitamin D deficiency, unspecified: Secondary | ICD-10-CM | POA: Diagnosis not present

## 2021-06-06 DIAGNOSIS — E7800 Pure hypercholesterolemia, unspecified: Secondary | ICD-10-CM | POA: Diagnosis not present

## 2021-06-06 DIAGNOSIS — K219 Gastro-esophageal reflux disease without esophagitis: Secondary | ICD-10-CM | POA: Diagnosis not present

## 2021-06-06 DIAGNOSIS — I10 Essential (primary) hypertension: Secondary | ICD-10-CM | POA: Diagnosis not present

## 2021-09-22 LAB — EXTERNAL GENERIC LAB PROCEDURE: COLOGUARD: NEGATIVE
